# Patient Record
Sex: Female | Born: 1950 | Race: White | Hispanic: No | Marital: Married | Smoking: Former smoker
Health system: Southern US, Community
[De-identification: ages and names within clinical notes are randomized; demographics above are authoritative.]

## PROBLEM LIST (undated history)

## (undated) DIAGNOSIS — M8580 Other specified disorders of bone density and structure, unspecified site: Secondary | ICD-10-CM

## (undated) DIAGNOSIS — R569 Unspecified convulsions: Secondary | ICD-10-CM

## (undated) DIAGNOSIS — C55 Malignant neoplasm of uterus, part unspecified: Secondary | ICD-10-CM

## (undated) DIAGNOSIS — IMO0002 Reserved for concepts with insufficient information to code with codable children: Secondary | ICD-10-CM

## (undated) DIAGNOSIS — C4490 Unspecified malignant neoplasm of skin, unspecified: Secondary | ICD-10-CM

## (undated) DIAGNOSIS — C50911 Malignant neoplasm of unspecified site of right female breast: Secondary | ICD-10-CM

## (undated) HISTORY — DX: Other specified disorders of bone density and structure, unspecified site: M85.80

## (undated) HISTORY — PX: DILATION AND CURETTAGE OF UTERUS: SHX78

## (undated) HISTORY — DX: Unspecified convulsions: R56.9

## (undated) HISTORY — PX: TUBAL LIGATION: SHX77

## (undated) HISTORY — PX: SQUAMOUS CELL CARCINOMA EXCISION: SHX2433

---

## 1997-03-18 DIAGNOSIS — C50911 Malignant neoplasm of unspecified site of right female breast: Secondary | ICD-10-CM

## 1997-03-18 HISTORY — PX: MASTECTOMY: SHX3

## 1997-03-18 HISTORY — PX: BREAST BIOPSY: SHX20

## 1997-03-18 HISTORY — DX: Malignant neoplasm of unspecified site of right female breast: C50.911

## 1997-07-26 ENCOUNTER — Ambulatory Visit (HOSPITAL_COMMUNITY): Admission: RE | Admit: 1997-07-26 | Discharge: 1997-07-26 | Payer: Self-pay | Admitting: Specialist

## 1997-09-21 ENCOUNTER — Other Ambulatory Visit: Admission: RE | Admit: 1997-09-21 | Discharge: 1997-09-21 | Payer: Self-pay | Admitting: Specialist

## 1997-11-15 ENCOUNTER — Other Ambulatory Visit: Admission: RE | Admit: 1997-11-15 | Discharge: 1997-11-15 | Payer: Self-pay | Admitting: Obstetrics and Gynecology

## 1997-12-26 ENCOUNTER — Encounter: Admission: RE | Admit: 1997-12-26 | Discharge: 1998-03-26 | Payer: Self-pay | Admitting: Radiation Oncology

## 1998-03-18 DIAGNOSIS — C55 Malignant neoplasm of uterus, part unspecified: Secondary | ICD-10-CM

## 1998-03-18 HISTORY — PX: ABDOMINAL HYSTERECTOMY: SHX81

## 1998-03-18 HISTORY — DX: Malignant neoplasm of uterus, part unspecified: C55

## 1998-11-16 ENCOUNTER — Other Ambulatory Visit: Admission: RE | Admit: 1998-11-16 | Discharge: 1998-11-16 | Payer: Self-pay | Admitting: Obstetrics and Gynecology

## 1998-11-22 ENCOUNTER — Ambulatory Visit (HOSPITAL_COMMUNITY): Admission: RE | Admit: 1998-11-22 | Discharge: 1998-11-22 | Payer: Self-pay | Admitting: Obstetrics and Gynecology

## 1998-11-22 ENCOUNTER — Encounter (INDEPENDENT_AMBULATORY_CARE_PROVIDER_SITE_OTHER): Payer: Self-pay

## 1998-12-11 ENCOUNTER — Ambulatory Visit (HOSPITAL_COMMUNITY): Admission: RE | Admit: 1998-12-11 | Discharge: 1998-12-11 | Payer: Self-pay | Admitting: Gastroenterology

## 1998-12-27 ENCOUNTER — Encounter (INDEPENDENT_AMBULATORY_CARE_PROVIDER_SITE_OTHER): Payer: Self-pay | Admitting: Specialist

## 1998-12-27 ENCOUNTER — Inpatient Hospital Stay (HOSPITAL_COMMUNITY): Admission: RE | Admit: 1998-12-27 | Discharge: 1998-12-29 | Payer: Self-pay | Admitting: Obstetrics and Gynecology

## 1999-08-23 ENCOUNTER — Other Ambulatory Visit: Admission: RE | Admit: 1999-08-23 | Discharge: 1999-08-23 | Payer: Self-pay | Admitting: Obstetrics and Gynecology

## 2000-03-27 ENCOUNTER — Other Ambulatory Visit: Admission: RE | Admit: 2000-03-27 | Discharge: 2000-03-27 | Payer: Self-pay | Admitting: Obstetrics and Gynecology

## 2000-09-16 ENCOUNTER — Other Ambulatory Visit: Admission: RE | Admit: 2000-09-16 | Discharge: 2000-09-16 | Payer: Self-pay | Admitting: Obstetrics and Gynecology

## 2001-03-10 ENCOUNTER — Other Ambulatory Visit: Admission: RE | Admit: 2001-03-10 | Discharge: 2001-03-10 | Payer: Self-pay | Admitting: Obstetrics and Gynecology

## 2001-09-24 ENCOUNTER — Other Ambulatory Visit: Admission: RE | Admit: 2001-09-24 | Discharge: 2001-09-24 | Payer: Self-pay | Admitting: Obstetrics & Gynecology

## 2002-03-29 ENCOUNTER — Other Ambulatory Visit: Admission: RE | Admit: 2002-03-29 | Discharge: 2002-03-29 | Payer: Self-pay | Admitting: Obstetrics and Gynecology

## 2002-10-11 ENCOUNTER — Other Ambulatory Visit: Admission: RE | Admit: 2002-10-11 | Discharge: 2002-10-11 | Payer: Self-pay | Admitting: Obstetrics and Gynecology

## 2004-01-26 ENCOUNTER — Ambulatory Visit: Payer: Self-pay | Admitting: Hematology & Oncology

## 2004-03-13 ENCOUNTER — Ambulatory Visit: Payer: Self-pay | Admitting: Hematology & Oncology

## 2004-09-17 ENCOUNTER — Ambulatory Visit: Payer: Self-pay | Admitting: Hematology & Oncology

## 2005-04-18 ENCOUNTER — Ambulatory Visit: Payer: Self-pay | Admitting: Hematology & Oncology

## 2005-10-07 ENCOUNTER — Ambulatory Visit: Payer: Self-pay | Admitting: Hematology & Oncology

## 2005-10-11 LAB — COMPREHENSIVE METABOLIC PANEL
Albumin: 4.3 g/dL (ref 3.5–5.2)
CO2: 28 mEq/L (ref 19–32)
Calcium: 8.8 mg/dL (ref 8.4–10.5)
Glucose, Bld: 115 mg/dL — ABNORMAL HIGH (ref 70–99)
Sodium: 139 mEq/L (ref 135–145)
Total Bilirubin: 0.5 mg/dL (ref 0.3–1.2)
Total Protein: 6 g/dL (ref 6.0–8.3)

## 2005-10-11 LAB — CBC WITH DIFFERENTIAL/PLATELET
Eosinophils Absolute: 0.1 10*3/uL (ref 0.0–0.5)
HCT: 37.9 % (ref 34.8–46.6)
LYMPH%: 27.5 % (ref 14.0–48.0)
MONO#: 0.4 10*3/uL (ref 0.1–0.9)
NEUT#: 2.8 10*3/uL (ref 1.5–6.5)
Platelets: 282 10*3/uL (ref 145–400)
RBC: 4.06 10*6/uL (ref 3.70–5.32)
WBC: 4.6 10*3/uL (ref 3.9–10.0)

## 2006-04-08 ENCOUNTER — Ambulatory Visit: Payer: Self-pay | Admitting: Hematology & Oncology

## 2006-04-11 LAB — CBC WITH DIFFERENTIAL/PLATELET
Eosinophils Absolute: 0.1 10*3/uL (ref 0.0–0.5)
MONO#: 0.5 10*3/uL (ref 0.1–0.9)
NEUT#: 3.3 10*3/uL (ref 1.5–6.5)
Platelets: 272 10*3/uL (ref 145–400)
RBC: 4.22 10*6/uL (ref 3.70–5.32)
RDW: 11.9 % (ref 11.3–14.5)
WBC: 5.4 10*3/uL (ref 3.9–10.0)
lymph#: 1.4 10*3/uL (ref 0.9–3.3)

## 2006-04-11 LAB — COMPREHENSIVE METABOLIC PANEL
ALT: 31 U/L (ref 0–35)
AST: 22 U/L (ref 0–37)
Chloride: 105 mEq/L (ref 96–112)
Creatinine, Ser: 0.87 mg/dL (ref 0.40–1.20)
Sodium: 141 mEq/L (ref 135–145)
Total Bilirubin: 0.4 mg/dL (ref 0.3–1.2)
Total Protein: 6.4 g/dL (ref 6.0–8.3)

## 2006-05-23 ENCOUNTER — Encounter (INDEPENDENT_AMBULATORY_CARE_PROVIDER_SITE_OTHER): Payer: Self-pay | Admitting: Specialist

## 2006-05-23 ENCOUNTER — Ambulatory Visit (HOSPITAL_COMMUNITY): Admission: RE | Admit: 2006-05-23 | Discharge: 2006-05-23 | Payer: Self-pay | Admitting: General Surgery

## 2006-10-07 ENCOUNTER — Ambulatory Visit: Payer: Self-pay | Admitting: Hematology & Oncology

## 2006-10-10 LAB — CBC WITH DIFFERENTIAL/PLATELET
BASO%: 0.8 % (ref 0.0–2.0)
EOS%: 1.9 % (ref 0.0–7.0)
HCT: 38.1 % (ref 34.8–46.6)
LYMPH%: 27.4 % (ref 14.0–48.0)
MCH: 31.8 pg (ref 26.0–34.0)
MCHC: 34.8 g/dL (ref 32.0–36.0)
MCV: 91.6 fL (ref 81.0–101.0)
MONO%: 9.9 % (ref 0.0–13.0)
NEUT%: 60 % (ref 39.6–76.8)
Platelets: 269 10*3/uL (ref 145–400)

## 2006-10-10 LAB — COMPREHENSIVE METABOLIC PANEL
ALT: 34 U/L (ref 0–35)
AST: 28 U/L (ref 0–37)
Alkaline Phosphatase: 46 U/L (ref 39–117)
CO2: 26 mEq/L (ref 19–32)
Creatinine, Ser: 0.87 mg/dL (ref 0.40–1.20)
Total Bilirubin: 0.5 mg/dL (ref 0.3–1.2)

## 2007-04-16 ENCOUNTER — Ambulatory Visit: Payer: Self-pay | Admitting: Hematology & Oncology

## 2007-04-20 LAB — CBC WITH DIFFERENTIAL/PLATELET
Eosinophils Absolute: 0.1 10*3/uL (ref 0.0–0.5)
HCT: 39 % (ref 34.8–46.6)
LYMPH%: 25.1 % (ref 14.0–48.0)
MONO#: 0.4 10*3/uL (ref 0.1–0.9)
NEUT#: 3.9 10*3/uL (ref 1.5–6.5)
NEUT%: 66.4 % (ref 39.6–76.8)
Platelets: 266 10*3/uL (ref 145–400)
WBC: 5.9 10*3/uL (ref 3.9–10.0)

## 2007-04-20 LAB — COMPREHENSIVE METABOLIC PANEL
BUN: 21 mg/dL (ref 6–23)
CO2: 25 mEq/L (ref 19–32)
Creatinine, Ser: 1.26 mg/dL — ABNORMAL HIGH (ref 0.40–1.20)
Glucose, Bld: 95 mg/dL (ref 70–99)
Total Bilirubin: 0.5 mg/dL (ref 0.3–1.2)
Total Protein: 6.5 g/dL (ref 6.0–8.3)

## 2007-10-22 ENCOUNTER — Ambulatory Visit: Payer: Self-pay | Admitting: Hematology & Oncology

## 2007-10-26 LAB — COMPREHENSIVE METABOLIC PANEL
ALT: 25 U/L (ref 0–35)
Alkaline Phosphatase: 45 U/L (ref 39–117)
CO2: 25 mEq/L (ref 19–32)
Creatinine, Ser: 0.82 mg/dL (ref 0.40–1.20)
Glucose, Bld: 114 mg/dL — ABNORMAL HIGH (ref 70–99)
Total Bilirubin: 0.5 mg/dL (ref 0.3–1.2)

## 2007-10-26 LAB — CBC WITH DIFFERENTIAL (CANCER CENTER ONLY)
BASO%: 0.3 % (ref 0.0–2.0)
EOS%: 1.4 % (ref 0.0–7.0)
LYMPH%: 26.1 % (ref 14.0–48.0)
MCV: 93 fL (ref 81–101)
MONO#: 0.3 10*3/uL (ref 0.1–0.9)
MONO%: 7.8 % (ref 0.0–13.0)
Platelets: 272 10*3/uL (ref 145–400)
RDW: 10.9 % (ref 10.5–14.6)
WBC: 4.1 10*3/uL (ref 3.9–10.0)

## 2008-04-29 ENCOUNTER — Ambulatory Visit: Payer: Self-pay | Admitting: Hematology & Oncology

## 2008-05-02 LAB — CBC WITH DIFFERENTIAL (CANCER CENTER ONLY)
BASO%: 0.7 % (ref 0.0–2.0)
EOS%: 1.6 % (ref 0.0–7.0)
HCT: 37.9 % (ref 34.8–46.6)
LYMPH#: 1.3 10*3/uL (ref 0.9–3.3)
LYMPH%: 31.3 % (ref 14.0–48.0)
MCHC: 33.8 g/dL (ref 32.0–36.0)
MCV: 92 fL (ref 81–101)
NEUT%: 61.6 % (ref 39.6–80.0)
RDW: 10.4 % — ABNORMAL LOW (ref 10.5–14.6)

## 2008-05-02 LAB — COMPREHENSIVE METABOLIC PANEL
Albumin: 4.3 g/dL (ref 3.5–5.2)
CO2: 23 mEq/L (ref 19–32)
Glucose, Bld: 81 mg/dL (ref 70–99)
Potassium: 4 mEq/L (ref 3.5–5.3)
Sodium: 141 mEq/L (ref 135–145)
Total Protein: 6.6 g/dL (ref 6.0–8.3)

## 2009-01-20 ENCOUNTER — Ambulatory Visit: Payer: Self-pay | Admitting: Hematology & Oncology

## 2009-01-23 LAB — CBC WITH DIFFERENTIAL (CANCER CENTER ONLY)
BASO%: 0.5 % (ref 0.0–2.0)
HCT: 36.7 % (ref 34.8–46.6)
LYMPH%: 31.9 % (ref 14.0–48.0)
MCV: 90 fL (ref 81–101)
MONO#: 0.4 10*3/uL (ref 0.1–0.9)
NEUT%: 58.9 % (ref 39.6–80.0)
RDW: 11.1 % (ref 10.5–14.6)
WBC: 4.8 10*3/uL (ref 3.9–10.0)

## 2009-01-24 LAB — COMPREHENSIVE METABOLIC PANEL
Alkaline Phosphatase: 53 U/L (ref 39–117)
BUN: 15 mg/dL (ref 6–23)
Glucose, Bld: 89 mg/dL (ref 70–99)
Sodium: 143 mEq/L (ref 135–145)
Total Bilirubin: 0.4 mg/dL (ref 0.3–1.2)
Total Protein: 6.4 g/dL (ref 6.0–8.3)

## 2009-10-20 ENCOUNTER — Ambulatory Visit: Payer: Self-pay | Admitting: Hematology & Oncology

## 2009-10-23 LAB — CBC WITH DIFFERENTIAL (CANCER CENTER ONLY)
BASO#: 0 10*3/uL (ref 0.0–0.2)
EOS%: 1.7 % (ref 0.0–7.0)
HGB: 13.5 g/dL (ref 11.6–15.9)
LYMPH%: 26.1 % (ref 14.0–48.0)
MCH: 32.3 pg (ref 26.0–34.0)
MCHC: 34.4 g/dL (ref 32.0–36.0)
MCV: 94 fL (ref 81–101)
MONO%: 5.6 % (ref 0.0–13.0)
NEUT#: 3.3 10*3/uL (ref 1.5–6.5)

## 2009-10-24 LAB — COMPREHENSIVE METABOLIC PANEL
AST: 24 U/L (ref 0–37)
Albumin: 4.4 g/dL (ref 3.5–5.2)
Alkaline Phosphatase: 59 U/L (ref 39–117)
BUN: 19 mg/dL (ref 6–23)
Creatinine, Ser: 0.74 mg/dL (ref 0.40–1.20)
Potassium: 4.3 mEq/L (ref 3.5–5.3)
Total Bilirubin: 0.4 mg/dL (ref 0.3–1.2)

## 2010-08-03 NOTE — Op Note (Signed)
NAME:  Rebekah Merritt, Rebekah Merritt         ACCOUNT NO.:  [id number]   MEDICAL RECORD NO.:  [id number]          PATIENT TYPE:  AMB   LOCATION:  SDS                          FACILITY:  [facility]   PHYSICIAN:  Gabrielle Dare. Janee Morn, M.D.DATE OF BIRTH:  [birth year]   DATE OF PROCEDURE:  05/23/2006  DATE OF DISCHARGE:                               OPERATIVE REPORT   PREOPERATIVE DIAGNOSES:  1. Squamous cell carcinoma left calf.  2. Nevus, right shin.   POSTOPERATIVE DIAGNOSES:  1. Squamous cell carcinoma left calf.  2. Nevus, right shin.   PROCEDURES:  1. Excision nevus, right shin.  2. Wide excision squamous cell carcinoma left calf.   SURGEON:  Gabrielle Dare. Janee Morn, M.D.   HISTORY OF PRESENT ILLNESS:  The patient is a 60 year old female who I  evaluated in the office for squamous cell carcinoma of her left calf.  We plan wide excision of this area.  In addition, she had a nevus on her  right shin that she was concerned about.  We will plan to excise that at  the same time.   PROCEDURE IN DETAIL:  Informed consent was obtained.  The patient  received intravenous antibiotics.  She is brought to the operating room.  MAC anesthesia was administered by the anesthesia staff.  Both calves  were prepped and draped in sterile fashion.  Attention was first  directed to the nevus on the right shin.  A mixture of 0.50% Marcaine  epinephrine and 1% lidocaine was injected around the nevus. The nevus  was excised with an elliptical incision.  Wound was irrigated.  Hemostasis was obtained.  The wound was closed with running 4-0 Vicryl  subcuticular stitch.  Benzoin and Steri-Strips were applied.  Next,  attention was directed to the right shin.  A elliptical excision was  planned and measured out, giving good margins of least about a  centimeter and the area was infiltrated thoroughly with the local  anesthetic mixture.  Elliptical incision was made.  Subcutaneous tissues  were dissected down to the fascia and  it was removed and one excision.  The specimen was oriented for pathology with a silk suture.  Hemostasis  was obtained in the wound. Wound was irrigated.  Subcutaneous flaps were  raised medially and laterally over the fascia.  The wound was then  closed in layers.  The size of the excision was 8 x 3.5 cm.  The  subcutaneous tissues were approximated with interrupted 2-0 Vicryl  suture.  The wound was again irrigated and the  skin was closed with interrupted simple 3-0 nylon sutures.  The wound  came together without any significant tension.  Sterile dressings were  applied.  The patient tolerated the procedure well without apparent  complication.  Sponge, needle and instrument counts were all correct.  She was taken to the recovery in stable condition.      Gabrielle Dare Janee Morn, M.D.  Electronically Signed     BET/MEDQ  D:  05/23/2006  T:  05/23/2006  Job:  [id number]   cc:   Hope M. Danella Deis, M.D.

## 2010-10-15 ENCOUNTER — Encounter (HOSPITAL_BASED_OUTPATIENT_CLINIC_OR_DEPARTMENT_OTHER): Payer: BC Managed Care – PPO | Admitting: Hematology & Oncology

## 2010-10-15 ENCOUNTER — Other Ambulatory Visit: Payer: Self-pay | Admitting: Hematology & Oncology

## 2010-10-15 DIAGNOSIS — Z853 Personal history of malignant neoplasm of breast: Secondary | ICD-10-CM

## 2010-10-15 DIAGNOSIS — Z8542 Personal history of malignant neoplasm of other parts of uterus: Secondary | ICD-10-CM

## 2010-10-15 LAB — CBC WITH DIFFERENTIAL (CANCER CENTER ONLY)
Eosinophils Absolute: 0.1 10*3/uL (ref 0.0–0.5)
LYMPH%: 30.5 % (ref 14.0–48.0)
MCH: 32.2 pg (ref 26.0–34.0)
MCHC: 35 g/dL (ref 32.0–36.0)
MCV: 92 fL (ref 81–101)
MONO%: 10 % (ref 0.0–13.0)
Platelets: 305 10*3/uL (ref 145–400)
RBC: 4.29 10*6/uL (ref 3.70–5.32)

## 2010-10-15 LAB — COMPREHENSIVE METABOLIC PANEL
ALT: 32 U/L (ref 0–35)
AST: 27 U/L (ref 0–37)
Alkaline Phosphatase: 56 U/L (ref 39–117)
Chloride: 104 mEq/L (ref 96–112)
Creatinine, Ser: 0.78 mg/dL (ref 0.50–1.10)
Potassium: 4.2 mEq/L (ref 3.5–5.3)
Total Bilirubin: 0.5 mg/dL (ref 0.3–1.2)

## 2011-10-14 ENCOUNTER — Other Ambulatory Visit (HOSPITAL_BASED_OUTPATIENT_CLINIC_OR_DEPARTMENT_OTHER): Payer: BC Managed Care – PPO | Admitting: Lab

## 2011-10-14 ENCOUNTER — Ambulatory Visit (HOSPITAL_BASED_OUTPATIENT_CLINIC_OR_DEPARTMENT_OTHER): Payer: BC Managed Care – PPO | Admitting: Hematology & Oncology

## 2011-10-14 VITALS — BP 117/77 | HR 83 | Temp 97.4°F | Ht 66.0 in | Wt 144.0 lb

## 2011-10-14 DIAGNOSIS — Z853 Personal history of malignant neoplasm of breast: Secondary | ICD-10-CM

## 2011-10-14 DIAGNOSIS — C50919 Malignant neoplasm of unspecified site of unspecified female breast: Secondary | ICD-10-CM

## 2011-10-14 LAB — CBC WITH DIFFERENTIAL (CANCER CENTER ONLY)
BASO%: 0.7 % (ref 0.0–2.0)
HCT: 38.3 % (ref 34.8–46.6)
LYMPH#: 1.4 10*3/uL (ref 0.9–3.3)
MONO#: 0.4 10*3/uL (ref 0.1–0.9)
NEUT#: 2.4 10*3/uL (ref 1.5–6.5)
Platelets: 235 10*3/uL (ref 145–400)
RDW: 11.4 % (ref 11.1–15.7)
WBC: 4.3 10*3/uL (ref 3.9–10.0)

## 2011-10-14 LAB — COMPREHENSIVE METABOLIC PANEL
ALT: 26 U/L (ref 0–35)
AST: 20 U/L (ref 0–37)
CO2: 27 mEq/L (ref 19–32)
Calcium: 8.8 mg/dL (ref 8.4–10.5)
Chloride: 106 mEq/L (ref 96–112)
Sodium: 140 mEq/L (ref 135–145)
Total Protein: 6.1 g/dL (ref 6.0–8.3)

## 2011-10-14 NOTE — Progress Notes (Signed)
This office note has been dictated.

## 2011-10-15 NOTE — Progress Notes (Signed)
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  CURRENT THERAPY:  Observation.  INTERIM HISTORY:  Rebekah Merritt comes in for her yearly followup.  She is doing well.  She really has no specific complaints.  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  She may do this for another year.  She has not noted any problems with cough or shortness of breath.  She has had no nausea or vomiting.  There has been no change in bowel or bladder habits.  She has had no headache.  There have been no skin problems.  She has not had any change in medications.  PHYSICAL EXAMINATION:  This is a well-developed, well-nourished white female in no obvious distress.  Vital signs:  97.4, pulse 83, respiratory rate 18, blood pressure 117/77.  Weight is 144.  Head and neck:  Normocephalic, atraumatic skull.  There are no ocular or oral lesions.  There are no palpable cervical or supraclavicular lymph nodes. Lungs:  Clear bilaterally.  Cardiac:  Regular rate and rhythm with a normal S1 and S2.  There are no murmurs, rubs or bruits.  Breasts:  Left breast with no masses, edema or erythema.  There is no left axillary adenopathy.  Right breast is status post mastectomy.  There are no right chest wall nodules, erythema or warmth.  There is no right axillary adenopathy.  Back:  No tenderness over the spine, ribs, or hips. Abdomen:  Soft with good bowel sounds.  There is no palpable abdominal mass.  There is no fluid wave.  No palpable hepatosplenomegaly. Extremities:  No clubbing, cyanosis or edema.  She has good range of motion of her joints.  Neurologic:  No focal neurological deficits.  LABORATORY STUDIES:  White cell count is 4.3, hemoglobin 12.9, hematocrit 38.3, platelet count 235.  Sodium 140, potassium 4, BUN 15, creatinine 0.72.  Calcium 8.8 with an albumin of 4.1.  IMPRESSION:  Rebekah Merritt is a 61 year old white  female with a past history of stage IIA ductal carcinoma of the right breast.  She underwent mastectomy.  She did undergo adjuvant chemotherapy.  She had 1 lymph node that was positive.  I do not see any evidence of recurrence.  We reassured her. We will plan to get her back yearly. I did tell her to take an extra vitamin D.  We are checking her vitamin D levels.  I just want to make sure she gets enough vitamin D in her.  I also want to make sure she takes a baby aspirin a day.   ______________________________ Josph Macho, M.D. PRE/MEDQ  D:  10/14/2011  T:  10/15/2011  Job:  [id number]

## 2011-10-17 ENCOUNTER — Telehealth: Payer: Self-pay | Admitting: *Deleted

## 2011-10-17 NOTE — Telephone Encounter (Signed)
Called patient to let her know that her labwork was all great per dr. Myna Hidalgo.  Left message on patients personal cell phone

## 2011-10-17 NOTE — Telephone Encounter (Signed)
Message copied by Anselm Jungling on Thu Oct 17, 2011 10:23 AM ------      Message from: Arlan Organ R      Created: Mon Oct 14, 2011  5:51 PM       Call - labs look great!!!  pete

## 2012-09-10 ENCOUNTER — Other Ambulatory Visit: Payer: Self-pay | Admitting: Dermatology

## 2012-10-13 ENCOUNTER — Ambulatory Visit (HOSPITAL_BASED_OUTPATIENT_CLINIC_OR_DEPARTMENT_OTHER): Payer: BC Managed Care – PPO | Admitting: Hematology & Oncology

## 2012-10-13 ENCOUNTER — Ambulatory Visit (HOSPITAL_BASED_OUTPATIENT_CLINIC_OR_DEPARTMENT_OTHER): Payer: BC Managed Care – PPO | Admitting: Lab

## 2012-10-13 VITALS — BP 109/71 | HR 81 | Temp 98.3°F | Resp 16 | Ht 66.0 in | Wt 144.0 lb

## 2012-10-13 DIAGNOSIS — C50911 Malignant neoplasm of unspecified site of right female breast: Secondary | ICD-10-CM

## 2012-10-13 DIAGNOSIS — Z853 Personal history of malignant neoplasm of breast: Secondary | ICD-10-CM

## 2012-10-13 DIAGNOSIS — C50919 Malignant neoplasm of unspecified site of unspecified female breast: Secondary | ICD-10-CM

## 2012-10-13 DIAGNOSIS — M810 Age-related osteoporosis without current pathological fracture: Secondary | ICD-10-CM

## 2012-10-13 LAB — CBC WITH DIFFERENTIAL (CANCER CENTER ONLY)
BASO%: 0.6 % (ref 0.0–2.0)
Eosinophils Absolute: 0.1 10*3/uL (ref 0.0–0.5)
LYMPH#: 1.5 10*3/uL (ref 0.9–3.3)
MONO#: 0.5 10*3/uL (ref 0.1–0.9)
Platelets: 266 10*3/uL (ref 145–400)
RBC: 4.38 10*6/uL (ref 3.70–5.32)
RDW: 11.4 % (ref 11.1–15.7)
WBC: 5 10*3/uL (ref 3.9–10.0)

## 2012-10-13 NOTE — Progress Notes (Signed)
This office note has been dictated.

## 2012-10-14 LAB — VITAMIN D 25 HYDROXY (VIT D DEFICIENCY, FRACTURES): Vit D, 25-Hydroxy: 46 ng/mL (ref 30–89)

## 2012-10-14 LAB — COMPREHENSIVE METABOLIC PANEL
ALT: 24 U/L (ref 0–35)
Albumin: 4.5 g/dL (ref 3.5–5.2)
CO2: 28 mEq/L (ref 19–32)
Calcium: 9.2 mg/dL (ref 8.4–10.5)
Chloride: 104 mEq/L (ref 96–112)
Potassium: 4.4 mEq/L (ref 3.5–5.3)
Sodium: 138 mEq/L (ref 135–145)
Total Protein: 6.8 g/dL (ref 6.0–8.3)

## 2012-10-14 NOTE — Progress Notes (Signed)
CC:   Dario Guardian, M.D.  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  CURRENT THERAPY:  Observation.  INTERIM HISTORY:  Ms. Asberry comes in for followup.  She is doing well.  We see her yearly.  Since we last saw her, she has had no problems.  She has had no fevers, sweats, or chills.  She got through the wintertime without any problem with infections.  There has been no changes with bowels or bladder.  She has had no abdominal pain.  She has had no cough.  She has had no leg swelling.  There has been no rashes.  PHYSICAL EXAMINATION:  General:  This is a well-developed, well- nourished white female in no obvious distress.  Vital signs: Temperature of 98.3, pulse 81, respiratory rate 16, blood pressure 109/71.  Weight is 144.  Head and neck:  Normocephalic, atraumatic skull.  There are no ocular or oral lesions.  There are no palpable cervical or supraclavicular lymph nodes.  Lungs:  Clear bilaterally. Cardiac:  Regular rate and rhythm with a normal S1, S2.  There are no murmurs, rubs or bruits.  Breasts:  Shows left breast with no masses, edema or erythema.  There is no left axillary adenopathy.  Right chest wall shows well-healed mastectomy.  There is no right chest wall nodules.  Extremities:  Show no clubbing, cyanosis or edema.  Back: Shows no kyphosis.  Abdomen:  Soft.  She has good bowel sounds.  There is no fluid wave.  There is no palpable hepatosplenomegaly.  Skin: Shows no rashes, ecchymosis, or petechia.  LABORATORY STUDIES:  White cell count 5, hemoglobin 14, hematocrit 41, platelet count 266.  IMPRESSION:  Ms. Rehm is a very charming 62 year old white female with stage IIA ductal carcinoma of the right breast.  She had this 16 years ago.  She had 1 positive lymph node.  She underwent a mastectomy. She had adjuvant chemotherapy.  For now, we will get her back yearly.  I do not see any need for any labs or x-rays in between  visits.  She will continue to take her vitamin D and also baby aspirin.    ______________________________ Josph Macho, M.D. PRE/MEDQ  D:  10/13/2012  T:  10/14/2012  Job:  [id number]

## 2012-10-15 ENCOUNTER — Telehealth: Payer: Self-pay | Admitting: Oncology

## 2012-10-15 ENCOUNTER — Encounter: Payer: Self-pay | Admitting: Oncology

## 2012-10-15 NOTE — Telephone Encounter (Addendum)
Message copied by Lacie Draft on Thu Oct 15, 2012  4:40 PM ------      Message from: Arlan Organ R      Created: Wed Oct 14, 2012  9:08 PM       Call - labs are ok!!  Please mail to her!!  Cindee Lame ------Spoke with patient, mailed labs also.

## 2012-11-18 ENCOUNTER — Encounter: Payer: Self-pay | Admitting: Hematology & Oncology

## 2013-09-14 ENCOUNTER — Other Ambulatory Visit: Payer: Self-pay | Admitting: Dermatology

## 2013-10-13 ENCOUNTER — Ambulatory Visit (HOSPITAL_BASED_OUTPATIENT_CLINIC_OR_DEPARTMENT_OTHER): Payer: BC Managed Care – PPO | Admitting: Hematology & Oncology

## 2013-10-13 ENCOUNTER — Other Ambulatory Visit (HOSPITAL_BASED_OUTPATIENT_CLINIC_OR_DEPARTMENT_OTHER): Payer: BC Managed Care – PPO | Admitting: Lab

## 2013-10-13 VITALS — BP 130/74 | HR 78 | Temp 98.5°F | Resp 16 | Wt 125.0 lb

## 2013-10-13 DIAGNOSIS — Z853 Personal history of malignant neoplasm of breast: Secondary | ICD-10-CM

## 2013-10-13 DIAGNOSIS — C50911 Malignant neoplasm of unspecified site of right female breast: Secondary | ICD-10-CM

## 2013-10-13 DIAGNOSIS — M810 Age-related osteoporosis without current pathological fracture: Secondary | ICD-10-CM

## 2013-10-13 DIAGNOSIS — C50919 Malignant neoplasm of unspecified site of unspecified female breast: Secondary | ICD-10-CM

## 2013-10-13 DIAGNOSIS — Z171 Estrogen receptor negative status [ER-]: Principal | ICD-10-CM

## 2013-10-13 LAB — CBC WITH DIFFERENTIAL (CANCER CENTER ONLY)
BASO#: 0 10*3/uL (ref 0.0–0.2)
BASO%: 1.1 % (ref 0.0–2.0)
EOS ABS: 0.1 10*3/uL (ref 0.0–0.5)
EOS%: 3.9 % (ref 0.0–7.0)
HCT: 41.3 % (ref 34.8–46.6)
HGB: 14.1 g/dL (ref 11.6–15.9)
LYMPH#: 1.1 10*3/uL (ref 0.9–3.3)
LYMPH%: 31.5 % (ref 14.0–48.0)
MCH: 32.3 pg (ref 26.0–34.0)
MCHC: 34.1 g/dL (ref 32.0–36.0)
MCV: 95 fL (ref 81–101)
MONO#: 0.4 10*3/uL (ref 0.1–0.9)
MONO%: 10.7 % (ref 0.0–13.0)
NEUT%: 52.8 % (ref 39.6–80.0)
NEUTROS ABS: 1.9 10*3/uL (ref 1.5–6.5)
Platelets: 253 10*3/uL (ref 145–400)
RBC: 4.36 10*6/uL (ref 3.70–5.32)
RDW: 11.4 % (ref 11.1–15.7)
WBC: 3.6 10*3/uL — AB (ref 3.9–10.0)

## 2013-10-13 LAB — CMP (CANCER CENTER ONLY)
ALK PHOS: 47 U/L (ref 26–84)
ALT: 24 U/L (ref 10–47)
AST: 31 U/L (ref 11–38)
Albumin: 3.9 g/dL (ref 3.3–5.5)
BUN, Bld: 17 mg/dL (ref 7–22)
CO2: 28 meq/L (ref 18–33)
Calcium: 9 mg/dL (ref 8.0–10.3)
Chloride: 99 mEq/L (ref 98–108)
Creat: 0.8 mg/dl (ref 0.6–1.2)
Glucose, Bld: 92 mg/dL (ref 73–118)
Potassium: 4.1 mEq/L (ref 3.3–4.7)
SODIUM: 143 meq/L (ref 128–145)
TOTAL PROTEIN: 7.1 g/dL (ref 6.4–8.1)
Total Bilirubin: 0.8 mg/dl (ref 0.20–1.60)

## 2013-10-14 LAB — VITAMIN D 25 HYDROXY (VIT D DEFICIENCY, FRACTURES): Vit D, 25-Hydroxy: 66 ng/mL (ref 30–89)

## 2013-10-14 NOTE — Progress Notes (Signed)
Hematology and Oncology Follow Up Visit  SHAILAH GIBBINS [id number] 25-May-1950 63 y.o. 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  Current Therapy:   Observation.     Interim History:  Ms.  Pearcy is in for her yearly followup. She is doing okay. Her husband, unforeseeing, had a heart attack. This actually was when [place] ago. He's doing well right now.  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  She's had no problems with nausea vomiting. There's been no bony pain. She has had no change in bowel or bladder habits. She's had no fatigue. There's been no rashes.  She is due for a mammogram in August.  She's had no issues with taking a baby aspirin. She is taking vitamin D.  Medications: Current outpatient prescriptions:Calcium Carbonate-Vit D-Min (CALCIUM 1200 PO), Take by mouth every morning., Disp: , Rfl: ;  Cyanocobalamin (VITAMIN B-12) 6000 MCG SUBL, Place under the tongue every morning., Disp: , Rfl: ;  desonide (DESOWEN) 0.05 % cream, , Disp: , Rfl: ;  l-methylfolate-b2-b6-b12 (CEREFOLIN) 08-16-48-5 MG TABS, Take 1 tablet by mouth daily., Disp: , Rfl:  Multiple Vitamin (MULTI-VITAMIN DAILY) TABS, Take by mouth every morning., Disp: , Rfl: ;  Omega-3 Fatty Acids (OMEGA 3 PO), Take by mouth every morning., Disp: , Rfl: ;  venlafaxine XR (EFFEXOR-XR) 75 MG 24 hr capsule, , Disp: , Rfl:   Allergies: No Known Allergies  Past Medical History, Surgical history, Social history, and Family History were reviewed and updated.  Review of Systems: As above  Physical Exam:  weight is 125 lb (56.7 kg). Her oral temperature is 98.5 F (36.9 C). Her blood pressure is 130/74 and her pulse is 78. Her respiration is 16.   Well-developed and well-nourished white female in no obvious distress. Head and neck exam shows no ocular or oral lesion. There are no palpable cervical or supraclavicular lymph  nodes. Lungs are clear bilaterally. Cardiac exam regular rate and rhythm with no murmurs rubs or bruits. Breast exam shows left breast with no masses edema or erythema. There is no left axillary adenopathy. Right chest wall shows a well-healed mastectomy. No right chest wall nodules are noted. There is no right axillary adenopathy. Back exam no tenderness over the spine. There is no kyphosis. Abdomen is soft. Has good bowel sounds. There is no fluid wave. Extremities shows no clubbing cyanosis or edema. Skin exam no rashes. Neurological exam is nonfocal.  Lab Results  Component Value Date   WBC 3.6* 10/13/2013   HGB 14.1 10/13/2013   HCT 41.3 10/13/2013   MCV 95 10/13/2013   PLT 253 10/13/2013     Chemistry      Component Value Date/Time   NA 143 10/13/2013 0945   NA 138 10/13/2012 1013   K 4.1 10/13/2013 0945   K 4.4 10/13/2012 1013   CL 99 10/13/2013 0945   CL 104 10/13/2012 1013   CO2 28 10/13/2013 0945   CO2 28 10/13/2012 1013   BUN 17 10/13/2013 0945   BUN 15 10/13/2012 1013   CREATININE 0.8 10/13/2013 0945   CREATININE 0.76 10/13/2012 1013      Component Value Date/Time   CALCIUM 9.0 10/13/2013 0945   CALCIUM 9.2 10/13/2012 1013   ALKPHOS 47 10/13/2013 0945   ALKPHOS 50 10/13/2012 1013   AST 31 10/13/2013 0945   AST 21 10/13/2012 1013   ALT 24 10/13/2013 0945  ALT 24 10/13/2012 1013   BILITOT 0.80 10/13/2013 0945   BILITOT 0.6 10/13/2012 1013         Impression and Plan: Ms. Coppa is 63 year old white female with a history of stage IIA ductal carcinoma the right breast. She had a mastectomy. She had one positive lymph node. Her tumor was ER negative. She received adjuvant chemotherapy.  She now is a from treatment by over 17 years.  For now, we will go ahead and plan to get her back in one year. She still likes to come back to see [place].   Volanda Napoleon, MD 7/30/20151:51 PM

## 2013-10-15 ENCOUNTER — Telehealth: Payer: Self-pay | Admitting: *Deleted

## 2013-10-15 NOTE — Telephone Encounter (Addendum)
Message copied by Lenn Sink on Fri Oct 15, 2013  8:36 AM ------      Message from: Burney Gauze R      Created: Thu Oct 14, 2013  6:12 PM       Call and let her know that her labs and vitamin D are fantastic. Thanks. Pete ------Informed pt that vit D and labs look good.

## 2014-08-16 ENCOUNTER — Other Ambulatory Visit: Payer: Self-pay

## 2014-08-16 DIAGNOSIS — C50911 Malignant neoplasm of unspecified site of right female breast: Secondary | ICD-10-CM

## 2014-08-16 DIAGNOSIS — Z9011 Acquired absence of right breast and nipple: Secondary | ICD-10-CM

## 2014-08-16 DIAGNOSIS — Z1231 Encounter for screening mammogram for malignant neoplasm of breast: Secondary | ICD-10-CM

## 2014-10-12 ENCOUNTER — Other Ambulatory Visit (HOSPITAL_BASED_OUTPATIENT_CLINIC_OR_DEPARTMENT_OTHER): Payer: PPO

## 2014-10-12 ENCOUNTER — Encounter: Payer: Self-pay | Admitting: Hematology & Oncology

## 2014-10-12 ENCOUNTER — Ambulatory Visit (HOSPITAL_BASED_OUTPATIENT_CLINIC_OR_DEPARTMENT_OTHER): Payer: PPO | Admitting: Hematology & Oncology

## 2014-10-12 VITALS — BP 118/74 | HR 75 | Temp 98.0°F | Resp 14 | Ht 66.0 in | Wt 122.0 lb

## 2014-10-12 DIAGNOSIS — Z853 Personal history of malignant neoplasm of breast: Secondary | ICD-10-CM | POA: Diagnosis not present

## 2014-10-12 DIAGNOSIS — Z170 Estrogen receptor positive status [ER+]: Principal | ICD-10-CM

## 2014-10-12 DIAGNOSIS — C50911 Malignant neoplasm of unspecified site of right female breast: Secondary | ICD-10-CM

## 2014-10-12 DIAGNOSIS — M810 Age-related osteoporosis without current pathological fracture: Secondary | ICD-10-CM

## 2014-10-12 DIAGNOSIS — E559 Vitamin D deficiency, unspecified: Secondary | ICD-10-CM

## 2014-10-12 DIAGNOSIS — Z171 Estrogen receptor negative status [ER-]: Principal | ICD-10-CM

## 2014-10-12 LAB — COMPREHENSIVE METABOLIC PANEL (CC13)
ALK PHOS: 64 U/L (ref 40–150)
ALT: 42 U/L (ref 0–55)
ANION GAP: 7 meq/L (ref 3–11)
AST: 34 U/L (ref 5–34)
Albumin: 3.8 g/dL (ref 3.5–5.0)
BUN: 19.7 mg/dL (ref 7.0–26.0)
CO2: 29 mEq/L (ref 22–29)
Calcium: 8.9 mg/dL (ref 8.4–10.4)
Chloride: 105 mEq/L (ref 98–109)
Creatinine: 0.8 mg/dL (ref 0.6–1.1)
EGFR: 77 mL/min/{1.73_m2} — AB (ref >90)
Glucose: 93 mg/dl (ref 70–140)
Potassium: 4.4 mEq/L (ref 3.5–5.1)
SODIUM: 141 meq/L (ref 136–145)
Total Bilirubin: 0.38 mg/dL (ref 0.20–1.20)
Total Protein: 6.3 g/dL — ABNORMAL LOW (ref 6.4–8.3)

## 2014-10-12 LAB — CBC WITH DIFFERENTIAL (CANCER CENTER ONLY)
BASO#: 0.1 10*3/uL (ref 0.0–0.2)
BASO%: 1 % (ref 0.0–2.0)
EOS%: 4 % (ref 0.0–7.0)
Eosinophils Absolute: 0.2 10*3/uL (ref 0.0–0.5)
HCT: 37.9 % (ref 34.8–46.6)
HGB: 12.9 g/dL (ref 11.6–15.9)
LYMPH#: 1.1 10*3/uL (ref 0.9–3.3)
LYMPH%: 20.8 % (ref 14.0–48.0)
MCH: 31.9 pg (ref 26.0–34.0)
MCHC: 34 g/dL (ref 32.0–36.0)
MCV: 94 fL (ref 81–101)
MONO#: 0.6 10*3/uL (ref 0.1–0.9)
MONO%: 11.7 % (ref 0.0–13.0)
NEUT#: 3.2 10*3/uL (ref 1.5–6.5)
NEUT%: 62.5 % (ref 39.6–80.0)
Platelets: 287 10*3/uL (ref 145–400)
RBC: 4.05 10*6/uL (ref 3.70–5.32)
RDW: 11.9 % (ref 11.1–15.7)
WBC: 5.1 10*3/uL (ref 3.9–10.0)

## 2014-10-12 LAB — VITAMIN D 25 HYDROXY (VIT D DEFICIENCY, FRACTURES): VIT D 25 HYDROXY: 43 ng/mL (ref 30–100)

## 2014-10-13 ENCOUNTER — Telehealth: Payer: Self-pay | Admitting: *Deleted

## 2014-10-13 NOTE — Telephone Encounter (Addendum)
Patient aware of results  ----- Message from Volanda Napoleon, MD sent at 10/12/2014  1:31 PM EDT ----- Please call and tell her that her labs look fantastic. I will call her next with her vitamin D level. Thanks  Call - VIt D level is great!!!! pete

## 2014-10-13 NOTE — Progress Notes (Signed)
Hematology and Oncology Follow Up Visit  Rebekah Merritt [id number] 12/16/1950 64 y.o. 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  Current Therapy:   Observation.     Interim History:  Ms.  Merritt is in for her yearly followup. She is doing okay. Her husband, is doing well. He had a heart attack about a year and half ago. He is recovered from this totally.,   She is now retired. She is enjoying retirement. She is trying to stay active..  She's had no problems with nausea or vomiting. There's been no bony pain. She has had no change in bowel or bladder habits. She's had no fatigue. There's been no rashes.  She is due for a mammogram in August.  She's had no issues with taking a baby aspirin. She is taking vitamin D. Her vitamin D level when last saw her was 44.  Overall, her performance status is ECOG 0.  Medications:  Current outpatient prescriptions:  .  Calcium Carbonate-Vit D-Min (CALCIUM 1200 PO), Take by mouth every morning., Disp: , Rfl:  .  Cyanocobalamin (VITAMIN B-12) 6000 MCG SUBL, Place under the tongue every morning., Disp: , Rfl:  .  l-methylfolate-b2-b6-b12 (CEREFOLIN) 08-16-48-5 MG TABS, Take 1 tablet by mouth daily., Disp: , Rfl:  .  Multiple Vitamin (MULTI-VITAMIN DAILY) TABS, Take by mouth every morning., Disp: , Rfl:  .  Omega-3 Fatty Acids (OMEGA 3 PO), Take by mouth every morning., Disp: , Rfl:  .  venlafaxine XR (EFFEXOR-XR) 75 MG 24 hr capsule, , Disp: , Rfl:   Allergies: No Known Allergies  Past Medical History, Surgical history, Social history, and Family History were reviewed and updated.  Review of Systems: As above  Physical Exam:  height is 5\' 6"  (1.676 m) and weight is 122 lb (55.339 kg). Her oral temperature is 98 F (36.7 C). Her blood pressure is 118/74 and her pulse is 75. Her respiration is 14.   Well-developed and well-nourished white female in no obvious distress. Head and neck  exam shows no ocular or oral lesion. There are no palpable cervical or supraclavicular lymph nodes. Lungs are clear bilaterally. Cardiac exam regular rate and rhythm with no murmurs rubs or bruits. Breast exam shows left breast with no masses edema or erythema. There is no left axillary adenopathy. Right chest wall shows a well-healed mastectomy. No right chest wall nodules are noted. There is no right axillary adenopathy. Back exam no tenderness over the spine. There is no kyphosis. Abdomen is soft. Has good bowel sounds. There is no fluid wave. Extremities shows no clubbing cyanosis or edema. Skin exam no rashes. Neurological exam is nonfocal.  Lab Results  Component Value Date   WBC 5.1 10/12/2014   HGB 12.9 10/12/2014   HCT 37.9 10/12/2014   MCV 94 10/12/2014   PLT 287 10/12/2014     Chemistry      Component Value Date/Time   NA 141 10/12/2014 0950   NA 143 10/13/2013 0945   NA 138 10/13/2012 1013   K 4.4 10/12/2014 0950   K 4.1 10/13/2013 0945   K 4.4 10/13/2012 1013   CL 99 10/13/2013 0945   CL 104 10/13/2012 1013   CO2 29 10/12/2014 0950   CO2 28 10/13/2013 0945   CO2 28 10/13/2012 1013   BUN 19.7 10/12/2014 0950   BUN 17 10/13/2013 0945   BUN 15 10/13/2012 1013   CREATININE 0.8 10/12/2014 0950  CREATININE 0.8 10/13/2013 0945   CREATININE 0.76 10/13/2012 1013      Component Value Date/Time   CALCIUM 8.9 10/12/2014 0950   CALCIUM 9.0 10/13/2013 0945   CALCIUM 9.2 10/13/2012 1013   ALKPHOS 64 10/12/2014 0950   ALKPHOS 47 10/13/2013 0945   ALKPHOS 50 10/13/2012 1013   AST 34 10/12/2014 0950   AST 31 10/13/2013 0945   AST 21 10/13/2012 1013   ALT 42 10/12/2014 0950   ALT 24 10/13/2013 0945   ALT 24 10/13/2012 1013   BILITOT 0.38 10/12/2014 0950   BILITOT 0.80 10/13/2013 0945   BILITOT 0.6 10/13/2012 1013         Impression and Plan: Rebekah Merritt is 64 year old white female with a history of stage IIA ductal carcinoma the right breast. She had a mastectomy.  She had one positive lymph node. Her tumor was ER negative. She received adjuvant chemotherapy.  She now is out from treatment by over 17 years. I truly believe that she is cured. She will lites come back to see [place] as she feels confident that we will give her a thorough examination to make sure nothing is happening with her breast cancer.  For now, we will go ahead and plan to get her back in one year.    Volanda Napoleon, MD 7/28/20167:39 AM

## 2014-12-06 ENCOUNTER — Ambulatory Visit
Admission: RE | Admit: 2014-12-06 | Discharge: 2014-12-06 | Disposition: A | Discharge status: Home / Self Care | Payer: PPO | Source: Ambulatory Visit

## 2014-12-06 DIAGNOSIS — Z9011 Acquired absence of right breast and nipple: Secondary | ICD-10-CM

## 2014-12-06 DIAGNOSIS — C50911 Malignant neoplasm of unspecified site of right female breast: Secondary | ICD-10-CM

## 2014-12-06 DIAGNOSIS — Z1231 Encounter for screening mammogram for malignant neoplasm of breast: Secondary | ICD-10-CM

## 2015-06-26 DIAGNOSIS — M797 Fibromyalgia: Secondary | ICD-10-CM | POA: Diagnosis not present

## 2015-06-26 DIAGNOSIS — Z853 Personal history of malignant neoplasm of breast: Secondary | ICD-10-CM | POA: Diagnosis not present

## 2015-06-26 DIAGNOSIS — Z0000 Encounter for general adult medical examination without abnormal findings: Secondary | ICD-10-CM | POA: Diagnosis not present

## 2015-06-26 DIAGNOSIS — Z1211 Encounter for screening for malignant neoplasm of colon: Secondary | ICD-10-CM | POA: Diagnosis not present

## 2015-06-26 DIAGNOSIS — M8580 Other specified disorders of bone density and structure, unspecified site: Secondary | ICD-10-CM | POA: Diagnosis not present

## 2015-06-26 DIAGNOSIS — E782 Mixed hyperlipidemia: Secondary | ICD-10-CM | POA: Diagnosis not present

## 2015-07-14 DIAGNOSIS — Z1211 Encounter for screening for malignant neoplasm of colon: Secondary | ICD-10-CM | POA: Diagnosis not present

## 2015-09-21 DIAGNOSIS — Z86018 Personal history of other benign neoplasm: Secondary | ICD-10-CM | POA: Diagnosis not present

## 2015-09-21 DIAGNOSIS — D2230 Melanocytic nevi of unspecified part of face: Secondary | ICD-10-CM | POA: Diagnosis not present

## 2015-09-21 DIAGNOSIS — D485 Neoplasm of uncertain behavior of skin: Secondary | ICD-10-CM | POA: Diagnosis not present

## 2015-09-21 DIAGNOSIS — L821 Other seborrheic keratosis: Secondary | ICD-10-CM | POA: Diagnosis not present

## 2015-09-21 DIAGNOSIS — D225 Melanocytic nevi of trunk: Secondary | ICD-10-CM | POA: Diagnosis not present

## 2015-09-21 DIAGNOSIS — D2272 Melanocytic nevi of left lower limb, including hip: Secondary | ICD-10-CM | POA: Diagnosis not present

## 2015-09-21 DIAGNOSIS — L720 Epidermal cyst: Secondary | ICD-10-CM | POA: Diagnosis not present

## 2015-09-21 DIAGNOSIS — Z85828 Personal history of other malignant neoplasm of skin: Secondary | ICD-10-CM | POA: Diagnosis not present

## 2015-10-12 ENCOUNTER — Ambulatory Visit (HOSPITAL_BASED_OUTPATIENT_CLINIC_OR_DEPARTMENT_OTHER): Payer: PPO | Admitting: Hematology & Oncology

## 2015-10-12 ENCOUNTER — Other Ambulatory Visit (HOSPITAL_BASED_OUTPATIENT_CLINIC_OR_DEPARTMENT_OTHER): Payer: PPO

## 2015-10-12 VITALS — BP 116/78 | HR 79 | Temp 98.2°F | Resp 18 | Wt 130.0 lb

## 2015-10-12 DIAGNOSIS — C50911 Malignant neoplasm of unspecified site of right female breast: Secondary | ICD-10-CM

## 2015-10-12 DIAGNOSIS — Z853 Personal history of malignant neoplasm of breast: Secondary | ICD-10-CM

## 2015-10-12 DIAGNOSIS — Z170 Estrogen receptor positive status [ER+]: Principal | ICD-10-CM

## 2015-10-12 DIAGNOSIS — E559 Vitamin D deficiency, unspecified: Secondary | ICD-10-CM

## 2015-10-12 DIAGNOSIS — M818 Other osteoporosis without current pathological fracture: Secondary | ICD-10-CM

## 2015-10-12 DIAGNOSIS — T386X5A Adverse effect of antigonadotrophins, antiestrogens, antiandrogens, not elsewhere classified, initial encounter: Secondary | ICD-10-CM

## 2015-10-12 LAB — CBC WITH DIFFERENTIAL (CANCER CENTER ONLY)
BASO#: 0.1 10*3/uL (ref 0.0–0.2)
BASO%: 1.4 % (ref 0.0–2.0)
EOS%: 2 % (ref 0.0–7.0)
Eosinophils Absolute: 0.1 10*3/uL (ref 0.0–0.5)
HEMATOCRIT: 35.8 % (ref 34.8–46.6)
HGB: 11.7 g/dL (ref 11.6–15.9)
LYMPH#: 1.3 10*3/uL (ref 0.9–3.3)
LYMPH%: 28.7 % (ref 14.0–48.0)
MCH: 29.8 pg (ref 26.0–34.0)
MCHC: 32.7 g/dL (ref 32.0–36.0)
MCV: 91 fL (ref 81–101)
MONO#: 0.5 10*3/uL (ref 0.1–0.9)
MONO%: 12.2 % (ref 0.0–13.0)
NEUT#: 2.5 10*3/uL (ref 1.5–6.5)
NEUT%: 55.7 % (ref 39.6–80.0)
Platelets: 308 10*3/uL (ref 145–400)
RBC: 3.92 10*6/uL (ref 3.70–5.32)
RDW: 13.1 % (ref 11.1–15.7)
WBC: 4.4 10*3/uL (ref 3.9–10.0)

## 2015-10-12 LAB — COMPREHENSIVE METABOLIC PANEL
ALBUMIN: 3.7 g/dL (ref 3.5–5.0)
ALT: 32 U/L (ref 0–55)
AST: 28 U/L (ref 5–34)
Alkaline Phosphatase: 68 U/L (ref 40–150)
Anion Gap: 8 mEq/L (ref 3–11)
BILIRUBIN TOTAL: 0.41 mg/dL (ref 0.20–1.20)
BUN: 21.6 mg/dL (ref 7.0–26.0)
CO2: 27 mEq/L (ref 22–29)
CREATININE: 0.8 mg/dL (ref 0.6–1.1)
Calcium: 8.9 mg/dL (ref 8.4–10.4)
Chloride: 106 mEq/L (ref 98–109)
EGFR: 74 mL/min/{1.73_m2} — ABNORMAL LOW (ref >90)
GLUCOSE: 93 mg/dL (ref 70–140)
POTASSIUM: 4.8 meq/L (ref 3.5–5.1)
SODIUM: 141 meq/L (ref 136–145)
Total Protein: 6.5 g/dL (ref 6.4–8.3)

## 2015-10-12 NOTE — Progress Notes (Signed)
Hematology and Oncology Follow Up Visit  CAIDYN RUNSER [id number] 03/24/1950 65 y.o. 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  Current Therapy:   Observation.     Interim History:  Ms.  Dillen is in for her yearly followup. She is doing okay. Her husband, is doing well. He had a heart attack about a year and half ago. He is recovered from this totally., He had hernia surgery last year.  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 She has 3 daughters and they are doing well.  She is now retired. She is enjoying retirement. 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  She's had no problems with nausea or vomiting. There's been no bony pain. She has had no change in bowel or bladder habits. She's had no fatigue. There's been no rashes.  She is due for a mammogram in August.  She's had no issues with taking a baby aspirin. She is taking vitamin D. Her vitamin D level when last saw her was 22.  Overall, her performance status is ECOG 0.  Medications:  Current Outpatient Prescriptions:  .  Calcium Carbonate-Vit D-Min (CALCIUM 1200 PO), Take by mouth every morning., Disp: , Rfl:  .  Cyanocobalamin (VITAMIN B-12) 6000 MCG SUBL, Place under the tongue every morning., Disp: , Rfl:  .  l-methylfolate-b2-b6-b12 (CEREFOLIN) 08-16-48-5 MG TABS, Take 1 tablet by mouth daily., Disp: , Rfl:  .  Multiple Vitamin (MULTI-VITAMIN DAILY) TABS, Take by mouth every morning., Disp: , Rfl:  .  Omega-3 Fatty Acids (OMEGA 3 PO), Take by mouth every morning., Disp: , Rfl:  .  venlafaxine XR (EFFEXOR-XR) 75 MG 24 hr capsule, , Disp: , Rfl:   Allergies: No Known Allergies  Past Medical History, Surgical history, Social history, and Family History were reviewed and updated.  Review of Systems: As above  Physical Exam:  weight is 130 lb (59 kg). Her oral temperature is 98.2 F (36.8 C). Her blood pressure is 116/78 and  her pulse is 79. Her respiration is 18.   Well-developed and well-nourished white female in no obvious distress. Head and neck exam shows no ocular or oral lesion. There are no palpable cervical or supraclavicular lymph nodes. Lungs are clear bilaterally. Cardiac exam regular rate and rhythm with no murmurs rubs or bruits. Breast exam shows left breast with no masses edema or erythema. There is no left axillary adenopathy. Right chest wall shows a well-healed mastectomy. No right chest wall nodules are noted. There is no right axillary adenopathy. Back exam no tenderness over the spine. There is no kyphosis. Abdomen is soft. Has good bowel sounds. There is no fluid wave. Extremities shows no clubbing cyanosis or edema. Skin exam no rashes. Neurological exam is nonfocal.  Lab Results  Component Value Date   WBC 4.4 10/12/2015   HGB 11.7 10/12/2015   HCT 35.8 10/12/2015   MCV 91 10/12/2015   PLT 308 10/12/2015     Chemistry      Component Value Date/Time   NA 141 10/12/2014 0950   K 4.4 10/12/2014 0950   CL 99 10/13/2013 0945   CO2 29 10/12/2014 0950   BUN 19.7 10/12/2014 0950   CREATININE 0.8 10/12/2014 0950      Component Value Date/Time   CALCIUM 8.9 10/12/2014 0950   ALKPHOS 64 10/12/2014 0950   AST 34 10/12/2014 0950   ALT 42 10/12/2014 0950  BILITOT 0.38 10/12/2014 0950         Impression and Plan: Ms. Zampino is 65 year old white female with a history of stage IIA ductal carcinoma the right breast. She had a mastectomy. She had one positive lymph node. Her tumor was ER negative. She received adjuvant chemotherapy.  She now is out from treatment by over 18 years. I truly believe that she is cured. She likes to come back to see [place] as she feels confident that we will give her a thorough examination to make sure nothing is happening with her breast cancer.  For now, we will go ahead and plan to get her back in one year.    Volanda Napoleon, MD 7/27/201710:24 AM

## 2015-10-13 ENCOUNTER — Telehealth: Payer: Self-pay

## 2015-10-13 LAB — VITAMIN D 25 HYDROXY (VIT D DEFICIENCY, FRACTURES): VIT D 25 HYDROXY: 39.3 ng/mL (ref 30.0–100.0)

## 2015-10-13 NOTE — Telephone Encounter (Signed)
The following messages left on personalized VM per Dr Antonieta Pert request  "Vit D is ok! Keep taking the vit D!!! 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 [place] job!! pete "  Pt to contact office for questions/concern. dph

## 2015-10-26 ENCOUNTER — Other Ambulatory Visit: Payer: Self-pay | Admitting: Hematology & Oncology

## 2015-10-26 DIAGNOSIS — Z1231 Encounter for screening mammogram for malignant neoplasm of breast: Secondary | ICD-10-CM

## 2015-12-08 ENCOUNTER — Ambulatory Visit
Admission: RE | Admit: 2015-12-08 | Discharge: 2015-12-08 | Disposition: A | Discharge status: Home / Self Care | Payer: PPO | Source: Ambulatory Visit | Attending: Hematology & Oncology | Admitting: Hematology & Oncology

## 2015-12-08 DIAGNOSIS — Z1231 Encounter for screening mammogram for malignant neoplasm of breast: Secondary | ICD-10-CM | POA: Diagnosis not present

## 2016-06-26 DIAGNOSIS — E782 Mixed hyperlipidemia: Secondary | ICD-10-CM | POA: Diagnosis not present

## 2016-06-26 DIAGNOSIS — M797 Fibromyalgia: Secondary | ICD-10-CM | POA: Diagnosis not present

## 2016-06-26 DIAGNOSIS — Z853 Personal history of malignant neoplasm of breast: Secondary | ICD-10-CM | POA: Diagnosis not present

## 2016-06-26 DIAGNOSIS — Z1389 Encounter for screening for other disorder: Secondary | ICD-10-CM | POA: Diagnosis not present

## 2016-06-26 DIAGNOSIS — Z0000 Encounter for general adult medical examination without abnormal findings: Secondary | ICD-10-CM | POA: Diagnosis not present

## 2016-06-26 DIAGNOSIS — Z1211 Encounter for screening for malignant neoplasm of colon: Secondary | ICD-10-CM | POA: Diagnosis not present

## 2016-06-26 DIAGNOSIS — M8580 Other specified disorders of bone density and structure, unspecified site: Secondary | ICD-10-CM | POA: Diagnosis not present

## 2016-07-12 DIAGNOSIS — Z1211 Encounter for screening for malignant neoplasm of colon: Secondary | ICD-10-CM | POA: Diagnosis not present

## 2016-09-26 DIAGNOSIS — D2230 Melanocytic nevi of unspecified part of face: Secondary | ICD-10-CM | POA: Diagnosis not present

## 2016-09-26 DIAGNOSIS — Z86018 Personal history of other benign neoplasm: Secondary | ICD-10-CM | POA: Diagnosis not present

## 2016-09-26 DIAGNOSIS — D2272 Melanocytic nevi of left lower limb, including hip: Secondary | ICD-10-CM | POA: Diagnosis not present

## 2016-09-26 DIAGNOSIS — L821 Other seborrheic keratosis: Secondary | ICD-10-CM | POA: Diagnosis not present

## 2016-09-26 DIAGNOSIS — D225 Melanocytic nevi of trunk: Secondary | ICD-10-CM | POA: Diagnosis not present

## 2016-09-26 DIAGNOSIS — D2372 Other benign neoplasm of skin of left lower limb, including hip: Secondary | ICD-10-CM | POA: Diagnosis not present

## 2016-09-26 DIAGNOSIS — Z85828 Personal history of other malignant neoplasm of skin: Secondary | ICD-10-CM | POA: Diagnosis not present

## 2016-09-26 DIAGNOSIS — L570 Actinic keratosis: Secondary | ICD-10-CM | POA: Diagnosis not present

## 2016-10-08 ENCOUNTER — Other Ambulatory Visit: Payer: PPO

## 2016-10-08 ENCOUNTER — Ambulatory Visit: Payer: PPO | Admitting: Hematology & Oncology

## 2016-10-08 ENCOUNTER — Ambulatory Visit (HOSPITAL_BASED_OUTPATIENT_CLINIC_OR_DEPARTMENT_OTHER): Payer: PPO | Admitting: Family

## 2016-10-08 ENCOUNTER — Other Ambulatory Visit (HOSPITAL_BASED_OUTPATIENT_CLINIC_OR_DEPARTMENT_OTHER): Payer: PPO

## 2016-10-08 VITALS — BP 115/74 | HR 82 | Temp 98.5°F | Resp 18 | Wt 131.0 lb

## 2016-10-08 DIAGNOSIS — M818 Other osteoporosis without current pathological fracture: Secondary | ICD-10-CM

## 2016-10-08 DIAGNOSIS — Z853 Personal history of malignant neoplasm of breast: Secondary | ICD-10-CM | POA: Diagnosis not present

## 2016-10-08 DIAGNOSIS — C50911 Malignant neoplasm of unspecified site of right female breast: Secondary | ICD-10-CM | POA: Diagnosis not present

## 2016-10-08 DIAGNOSIS — T386X5A Adverse effect of antigonadotrophins, antiestrogens, antiandrogens, not elsewhere classified, initial encounter: Secondary | ICD-10-CM | POA: Diagnosis not present

## 2016-10-08 DIAGNOSIS — Z171 Estrogen receptor negative status [ER-]: Secondary | ICD-10-CM | POA: Diagnosis not present

## 2016-10-08 DIAGNOSIS — C50011 Malignant neoplasm of nipple and areola, right female breast: Secondary | ICD-10-CM

## 2016-10-08 LAB — CBC WITH DIFFERENTIAL (CANCER CENTER ONLY)
BASO#: 0 10*3/uL (ref 0.0–0.2)
BASO%: 0.7 % (ref 0.0–2.0)
EOS%: 2.1 % (ref 0.0–7.0)
Eosinophils Absolute: 0.1 10*3/uL (ref 0.0–0.5)
HCT: 40.4 % (ref 34.8–46.6)
HGB: 13.6 g/dL (ref 11.6–15.9)
LYMPH#: 1.4 10*3/uL (ref 0.9–3.3)
LYMPH%: 31.1 % (ref 14.0–48.0)
MCH: 32 pg (ref 26.0–34.0)
MCHC: 33.7 g/dL (ref 32.0–36.0)
MCV: 95 fL (ref 81–101)
MONO#: 0.5 10*3/uL (ref 0.1–0.9)
MONO%: 12.3 % (ref 0.0–13.0)
NEUT#: 2.4 10*3/uL (ref 1.5–6.5)
NEUT%: 53.8 % (ref 39.6–80.0)
PLATELETS: 285 10*3/uL (ref 145–400)
RBC: 4.25 10*6/uL (ref 3.70–5.32)
RDW: 11.8 % (ref 11.1–15.7)
WBC: 4.4 10*3/uL (ref 3.9–10.0)

## 2016-10-08 LAB — CMP (CANCER CENTER ONLY)
ALK PHOS: 56 U/L (ref 26–84)
ALT: 39 U/L (ref 10–47)
AST: 42 U/L — AB (ref 11–38)
Albumin: 3.6 g/dL (ref 3.3–5.5)
BILIRUBIN TOTAL: 0.8 mg/dL (ref 0.20–1.60)
BUN: 18 mg/dL (ref 7–22)
CO2: 30 mEq/L (ref 18–33)
CREATININE: 0.9 mg/dL (ref 0.6–1.2)
Calcium: 9.1 mg/dL (ref 8.0–10.3)
Chloride: 102 mEq/L (ref 98–108)
GLUCOSE: 97 mg/dL (ref 73–118)
POTASSIUM: 4.6 meq/L (ref 3.3–4.7)
Sodium: 141 mEq/L (ref 128–145)
TOTAL PROTEIN: 6.5 g/dL (ref 6.4–8.1)

## 2016-10-08 NOTE — Progress Notes (Signed)
Hematology and Oncology Follow Up Visit  Rebekah Merritt [id number] Jul 23, 1950 66 y.o. 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 Dottavio is here today for her annual follow-up. She is doing well and has no complaints at this time.  She is staying busy keeping her grandchildren and enjoying her new puppy.  She verbalized that she is taking her vitamin D and calcium daily.  Her mammogram in September was negative. Her breast exam today was negative. Her right breast mastectomy site was intact. No changes with the left breast.  No mass, lesion or rash noted. No lymphadenopathy present.  No c/o fatigue. No fever, chills, n/v, cough, rash, dizziness, SOB, chest pain, palpitations, abdominal pain or changes in bowel or bladder habits.  No swelling, tenderness, numbness or tingling in her extremities. No c/o pain at this time.  She has maintained a good appetite and is staying well hydrated. Her weight is stable.   ECOG Performance Status: 0 - Asymptomatic  Medications:  Allergies as of 10/08/2016   No Known Allergies     Medication List       Accurate as of 10/08/16 10:27 AM. Always use your most recent med list.          CALCIUM 1200 PO Take by mouth every morning.   l-methylfolate-b2-b6-b12 08-16-48-5 MG Tabs Commonly known as:  CEREFOLIN Take 1 tablet by mouth daily.   MULTI-VITAMIN DAILY Tabs Take by mouth every morning.   OMEGA 3 PO Take by mouth every morning.   venlafaxine XR 75 MG 24 hr capsule Commonly known as:  EFFEXOR-XR   Vitamin B-12 6000 MCG Subl Place under the tongue every morning.       Allergies: No Known Allergies  Past Medical History, Surgical history, Social history, and Family History were reviewed and updated.  Review of Systems: All other 10 point review of systems is negative.   Physical Exam:  weight is 131 lb (59.4  kg). Her oral temperature is 98.5 F (36.9 C). Her blood pressure is 115/74 and her pulse is 82. Her respiration is 18 and oxygen saturation is 99%.   Wt Readings from Last 3 Encounters:  10/08/16 131 lb (59.4 kg)  10/12/15 130 lb (59 kg)  10/12/14 122 lb (55.3 kg)    Ocular: Sclerae unicteric, pupils equal, round and reactive to light Ear-nose-throat: Oropharynx clear, dentition fair Lymphatic: No cervical, supraclavicular or axillary adenopathy Lungs no rales or rhonchi, good excursion bilaterally Heart regular rate and rhythm, no murmur appreciated Abd soft, nontender, positive bowel sounds, no liver or spleen tip palpated on exam, no fluid wave MSK no focal spinal tenderness, no joint edema Neuro: non-focal, well-oriented, appropriate affect Breasts: Right mastectomy intact, no changes with left breast. No mass, lesion or rash noted on exam.   Lab Results  Component Value Date   WBC 4.4 10/08/2016   HGB 13.6 10/08/2016   HCT 40.4 10/08/2016   MCV 95 10/08/2016   PLT 285 10/08/2016   No results found for: FERRITIN, IRON, TIBC, UIBC, IRONPCTSAT Lab Results  Component Value Date   RBC 4.25 10/08/2016   No results found for: KPAFRELGTCHN, LAMBDASER, KAPLAMBRATIO No results found for: IGGSERUM, IGA, IGMSERUM No results found for: Ronnald Ramp, A1GS, A2GS, Violet Baldy, MSPIKE, SPEI   Chemistry      Component Value Date/Time   NA 141 10/12/2015 0858  K 4.8 10/12/2015 0858   CL 99 10/13/2013 0945   CO2 27 10/12/2015 0858   BUN 21.6 10/12/2015 0858   CREATININE 0.8 10/12/2015 0858      Component Value Date/Time   CALCIUM 8.9 10/12/2015 0858   ALKPHOS 68 10/12/2015 0858   AST 28 10/12/2015 0858   ALT 32 10/12/2015 0858   BILITOT 0.41 10/12/2015 0858      Impression and Plan: Ms. Rivere is a very pleasant 67 yo caucasian female with history of stage IIA ductal carcinoma the right breast, ER negative and or positive lymph node. She completed  treatment with adjuvant chemotherapy over 19 years ago.  She continues to do well and so far there has been no evidence of recurrence.  We will continue to follow along with her and see her back again in 1 year.  She will scheduled her mammogram in September.  Over 50% of the 15 minute face to face appointment was spent counseling and coordinating her care.  She will contact our office with any questions or concerns. We can certainly see her sooner if need be.   Eliezer Bottom, NP 7/24/201810:27 AM

## 2016-10-09 LAB — VITAMIN D 25 HYDROXY (VIT D DEFICIENCY, FRACTURES): Vitamin D, 25-Hydroxy: 38.5 ng/mL (ref 30.0–100.0)

## 2016-10-10 ENCOUNTER — Telehealth: Payer: Self-pay | Admitting: *Deleted

## 2016-10-10 NOTE — Telephone Encounter (Addendum)
Patient is aware of results.   ----- Message from Volanda Napoleon, MD sent at 10/09/2016  5:54 AM EDT ----- Call - vit D level is ok!!! pete

## 2016-10-29 DIAGNOSIS — M8589 Other specified disorders of bone density and structure, multiple sites: Secondary | ICD-10-CM | POA: Diagnosis not present

## 2016-12-10 DIAGNOSIS — Z853 Personal history of malignant neoplasm of breast: Secondary | ICD-10-CM | POA: Diagnosis not present

## 2016-12-10 DIAGNOSIS — Z1231 Encounter for screening mammogram for malignant neoplasm of breast: Secondary | ICD-10-CM | POA: Diagnosis not present

## 2017-06-10 DIAGNOSIS — M5022 Other cervical disc displacement, mid-cervical region, unspecified level: Secondary | ICD-10-CM | POA: Diagnosis not present

## 2017-06-10 DIAGNOSIS — M47814 Spondylosis without myelopathy or radiculopathy, thoracic region: Secondary | ICD-10-CM | POA: Diagnosis not present

## 2017-06-10 DIAGNOSIS — M9901 Segmental and somatic dysfunction of cervical region: Secondary | ICD-10-CM | POA: Diagnosis not present

## 2017-06-10 DIAGNOSIS — M9902 Segmental and somatic dysfunction of thoracic region: Secondary | ICD-10-CM | POA: Diagnosis not present

## 2017-06-16 DIAGNOSIS — M5022 Other cervical disc displacement, mid-cervical region, unspecified level: Secondary | ICD-10-CM | POA: Diagnosis not present

## 2017-06-16 DIAGNOSIS — M47814 Spondylosis without myelopathy or radiculopathy, thoracic region: Secondary | ICD-10-CM | POA: Diagnosis not present

## 2017-06-16 DIAGNOSIS — M9901 Segmental and somatic dysfunction of cervical region: Secondary | ICD-10-CM | POA: Diagnosis not present

## 2017-06-16 DIAGNOSIS — M9902 Segmental and somatic dysfunction of thoracic region: Secondary | ICD-10-CM | POA: Diagnosis not present

## 2017-06-18 DIAGNOSIS — M9901 Segmental and somatic dysfunction of cervical region: Secondary | ICD-10-CM | POA: Diagnosis not present

## 2017-06-18 DIAGNOSIS — M5022 Other cervical disc displacement, mid-cervical region, unspecified level: Secondary | ICD-10-CM | POA: Diagnosis not present

## 2017-06-18 DIAGNOSIS — M47814 Spondylosis without myelopathy or radiculopathy, thoracic region: Secondary | ICD-10-CM | POA: Diagnosis not present

## 2017-06-18 DIAGNOSIS — M9902 Segmental and somatic dysfunction of thoracic region: Secondary | ICD-10-CM | POA: Diagnosis not present

## 2017-06-23 DIAGNOSIS — M9902 Segmental and somatic dysfunction of thoracic region: Secondary | ICD-10-CM | POA: Diagnosis not present

## 2017-06-23 DIAGNOSIS — M5022 Other cervical disc displacement, mid-cervical region, unspecified level: Secondary | ICD-10-CM | POA: Diagnosis not present

## 2017-06-23 DIAGNOSIS — M47814 Spondylosis without myelopathy or radiculopathy, thoracic region: Secondary | ICD-10-CM | POA: Diagnosis not present

## 2017-06-23 DIAGNOSIS — M9901 Segmental and somatic dysfunction of cervical region: Secondary | ICD-10-CM | POA: Diagnosis not present

## 2017-06-25 DIAGNOSIS — M47814 Spondylosis without myelopathy or radiculopathy, thoracic region: Secondary | ICD-10-CM | POA: Diagnosis not present

## 2017-06-25 DIAGNOSIS — M5022 Other cervical disc displacement, mid-cervical region, unspecified level: Secondary | ICD-10-CM | POA: Diagnosis not present

## 2017-06-25 DIAGNOSIS — M9901 Segmental and somatic dysfunction of cervical region: Secondary | ICD-10-CM | POA: Diagnosis not present

## 2017-06-25 DIAGNOSIS — M9902 Segmental and somatic dysfunction of thoracic region: Secondary | ICD-10-CM | POA: Diagnosis not present

## 2017-06-27 DIAGNOSIS — Z1211 Encounter for screening for malignant neoplasm of colon: Secondary | ICD-10-CM | POA: Diagnosis not present

## 2017-06-27 DIAGNOSIS — M8588 Other specified disorders of bone density and structure, other site: Secondary | ICD-10-CM | POA: Diagnosis not present

## 2017-06-27 DIAGNOSIS — E782 Mixed hyperlipidemia: Secondary | ICD-10-CM | POA: Diagnosis not present

## 2017-06-27 DIAGNOSIS — Z0000 Encounter for general adult medical examination without abnormal findings: Secondary | ICD-10-CM | POA: Diagnosis not present

## 2017-06-27 DIAGNOSIS — Z853 Personal history of malignant neoplasm of breast: Secondary | ICD-10-CM | POA: Diagnosis not present

## 2017-06-27 DIAGNOSIS — H9190 Unspecified hearing loss, unspecified ear: Secondary | ICD-10-CM | POA: Diagnosis not present

## 2017-06-27 DIAGNOSIS — Z1389 Encounter for screening for other disorder: Secondary | ICD-10-CM | POA: Diagnosis not present

## 2017-06-27 DIAGNOSIS — M797 Fibromyalgia: Secondary | ICD-10-CM | POA: Diagnosis not present

## 2017-06-30 DIAGNOSIS — M47814 Spondylosis without myelopathy or radiculopathy, thoracic region: Secondary | ICD-10-CM | POA: Diagnosis not present

## 2017-06-30 DIAGNOSIS — M9902 Segmental and somatic dysfunction of thoracic region: Secondary | ICD-10-CM | POA: Diagnosis not present

## 2017-06-30 DIAGNOSIS — M5022 Other cervical disc displacement, mid-cervical region, unspecified level: Secondary | ICD-10-CM | POA: Diagnosis not present

## 2017-06-30 DIAGNOSIS — M9901 Segmental and somatic dysfunction of cervical region: Secondary | ICD-10-CM | POA: Diagnosis not present

## 2017-07-08 DIAGNOSIS — E782 Mixed hyperlipidemia: Secondary | ICD-10-CM | POA: Diagnosis not present

## 2017-07-16 DIAGNOSIS — M9901 Segmental and somatic dysfunction of cervical region: Secondary | ICD-10-CM | POA: Diagnosis not present

## 2017-07-16 DIAGNOSIS — M5022 Other cervical disc displacement, mid-cervical region, unspecified level: Secondary | ICD-10-CM | POA: Diagnosis not present

## 2017-07-16 DIAGNOSIS — M47814 Spondylosis without myelopathy or radiculopathy, thoracic region: Secondary | ICD-10-CM | POA: Diagnosis not present

## 2017-07-16 DIAGNOSIS — M9902 Segmental and somatic dysfunction of thoracic region: Secondary | ICD-10-CM | POA: Diagnosis not present

## 2017-07-30 DIAGNOSIS — M9901 Segmental and somatic dysfunction of cervical region: Secondary | ICD-10-CM | POA: Diagnosis not present

## 2017-07-30 DIAGNOSIS — M47814 Spondylosis without myelopathy or radiculopathy, thoracic region: Secondary | ICD-10-CM | POA: Diagnosis not present

## 2017-07-30 DIAGNOSIS — M5022 Other cervical disc displacement, mid-cervical region, unspecified level: Secondary | ICD-10-CM | POA: Diagnosis not present

## 2017-07-30 DIAGNOSIS — M9902 Segmental and somatic dysfunction of thoracic region: Secondary | ICD-10-CM | POA: Diagnosis not present

## 2017-08-18 ENCOUNTER — Encounter (HOSPITAL_COMMUNITY): Payer: Self-pay

## 2017-08-18 ENCOUNTER — Inpatient Hospital Stay (HOSPITAL_COMMUNITY)
Admission: EM | Admit: 2017-08-18 | Discharge: 2017-08-22 | DRG: 340 | Disposition: A | Discharge status: Home / Self Care | Payer: PPO | Attending: General Surgery | Admitting: General Surgery

## 2017-08-18 DIAGNOSIS — Z79899 Other long term (current) drug therapy: Secondary | ICD-10-CM | POA: Diagnosis not present

## 2017-08-18 DIAGNOSIS — K3532 Acute appendicitis with perforation and localized peritonitis, without abscess: Secondary | ICD-10-CM | POA: Diagnosis present

## 2017-08-18 DIAGNOSIS — Z853 Personal history of malignant neoplasm of breast: Secondary | ICD-10-CM

## 2017-08-18 DIAGNOSIS — R1031 Right lower quadrant pain: Secondary | ICD-10-CM | POA: Diagnosis not present

## 2017-08-18 DIAGNOSIS — K37 Unspecified appendicitis: Secondary | ICD-10-CM | POA: Diagnosis present

## 2017-08-18 DIAGNOSIS — K381 Appendicular concretions: Secondary | ICD-10-CM | POA: Diagnosis present

## 2017-08-18 DIAGNOSIS — R109 Unspecified abdominal pain: Secondary | ICD-10-CM | POA: Diagnosis not present

## 2017-08-18 DIAGNOSIS — K3580 Unspecified acute appendicitis: Secondary | ICD-10-CM

## 2017-08-18 DIAGNOSIS — Z90710 Acquired absence of both cervix and uterus: Secondary | ICD-10-CM | POA: Diagnosis not present

## 2017-08-18 HISTORY — DX: Malignant neoplasm of uterus, part unspecified: C55

## 2017-08-18 HISTORY — DX: Malignant neoplasm of unspecified site of right female breast: C50.911

## 2017-08-18 HISTORY — DX: Reserved for concepts with insufficient information to code with codable children: IMO0002

## 2017-08-18 HISTORY — DX: Unspecified malignant neoplasm of skin, unspecified: C44.90

## 2017-08-18 LAB — URINALYSIS, ROUTINE W REFLEX MICROSCOPIC
BILIRUBIN URINE: NEGATIVE
Glucose, UA: NEGATIVE mg/dL
Hgb urine dipstick: NEGATIVE
KETONES UR: NEGATIVE mg/dL
LEUKOCYTES UA: NEGATIVE
NITRITE: NEGATIVE
PROTEIN: NEGATIVE mg/dL
Specific Gravity, Urine: 1.004 — ABNORMAL LOW (ref 1.005–1.030)
pH: 6 (ref 5.0–8.0)

## 2017-08-18 LAB — CBC
HCT: 39.3 % (ref 36.0–46.0)
Hemoglobin: 13.1 g/dL (ref 12.0–15.0)
MCH: 30.8 pg (ref 26.0–34.0)
MCHC: 33.3 g/dL (ref 30.0–36.0)
MCV: 92.5 fL (ref 78.0–100.0)
PLATELETS: 194 10*3/uL (ref 150–400)
RBC: 4.25 MIL/uL (ref 3.87–5.11)
RDW: 11.6 % (ref 11.5–15.5)
WBC: 10.9 10*3/uL — ABNORMAL HIGH (ref 4.0–10.5)

## 2017-08-18 LAB — COMPREHENSIVE METABOLIC PANEL
ALBUMIN: 3.9 g/dL (ref 3.5–5.0)
ALK PHOS: 47 U/L (ref 38–126)
ALT: 23 U/L (ref 14–54)
AST: 22 U/L (ref 15–41)
Anion gap: 9 (ref 5–15)
BILIRUBIN TOTAL: 0.8 mg/dL (ref 0.3–1.2)
BUN: 11 mg/dL (ref 6–20)
CALCIUM: 9.2 mg/dL (ref 8.9–10.3)
CO2: 27 mmol/L (ref 22–32)
Chloride: 106 mmol/L (ref 101–111)
Creatinine, Ser: 0.82 mg/dL (ref 0.44–1.00)
GFR calc Af Amer: >60 mL/min (ref >60)
GLUCOSE: 109 mg/dL — AB (ref 65–99)
POTASSIUM: 3.5 mmol/L (ref 3.5–5.1)
Sodium: 142 mmol/L (ref 135–145)
TOTAL PROTEIN: 6.7 g/dL (ref 6.5–8.1)

## 2017-08-18 LAB — LIPASE, BLOOD: Lipase: 20 U/L (ref 11–51)

## 2017-08-18 NOTE — ED Triage Notes (Signed)
Pt states that since yesterday she has been having lower abd pain, worse on the R side, denies urinary symptoms, denies n/v/d/fevers.

## 2017-08-19 ENCOUNTER — Observation Stay (HOSPITAL_COMMUNITY): Payer: PPO | Admitting: Anesthesiology

## 2017-08-19 ENCOUNTER — Encounter (HOSPITAL_COMMUNITY): Payer: Self-pay | Admitting: Anesthesiology

## 2017-08-19 ENCOUNTER — Encounter (HOSPITAL_COMMUNITY): Admission: EM | Disposition: A | Discharge status: Home / Self Care | Payer: Self-pay | Source: Home / Self Care

## 2017-08-19 ENCOUNTER — Emergency Department (HOSPITAL_COMMUNITY): Payer: PPO

## 2017-08-19 ENCOUNTER — Other Ambulatory Visit: Payer: Self-pay

## 2017-08-19 DIAGNOSIS — K37 Unspecified appendicitis: Secondary | ICD-10-CM | POA: Diagnosis present

## 2017-08-19 DIAGNOSIS — K3580 Unspecified acute appendicitis: Secondary | ICD-10-CM | POA: Diagnosis not present

## 2017-08-19 HISTORY — PX: APPENDECTOMY: SHX54

## 2017-08-19 HISTORY — PX: LAPAROSCOPIC APPENDECTOMY: SHX408

## 2017-08-19 SURGERY — APPENDECTOMY, LAPAROSCOPIC
Anesthesia: General | Site: Abdomen

## 2017-08-19 MED ORDER — ONDANSETRON HCL 4 MG/2ML IJ SOLN
4.0000 mg | Freq: Once | INTRAMUSCULAR | Status: AC
  Administered 2017-08-19: 4 mg via INTRAVENOUS
  Filled 2017-08-19 (×2): qty 2

## 2017-08-19 MED ORDER — IOHEXOL 300 MG/ML  SOLN
100.0000 mL | Freq: Once | INTRAMUSCULAR | Status: AC | PRN
  Administered 2017-08-19: 100 mL via INTRAVENOUS

## 2017-08-19 MED ORDER — FENTANYL CITRATE (PF) 100 MCG/2ML IJ SOLN: 25.0000 ug | INTRAMUSCULAR | Status: DC | PRN

## 2017-08-19 MED ORDER — MORPHINE SULFATE (PF) 4 MG/ML IV SOLN
4.0000 mg | Freq: Once | INTRAVENOUS | Status: AC
  Administered 2017-08-19: 2 mg via INTRAVENOUS
  Filled 2017-08-19 (×2): qty 1

## 2017-08-19 MED ORDER — FENTANYL CITRATE (PF) 250 MCG/5ML IJ SOLN
INTRAMUSCULAR | Status: DC | PRN
  Administered 2017-08-19: 50 ug via INTRAVENOUS
  Administered 2017-08-19: 25 ug via INTRAVENOUS
  Administered 2017-08-19: 50 ug via INTRAVENOUS
  Administered 2017-08-19 (×3): 25 ug via INTRAVENOUS
  Administered 2017-08-19: 50 ug via INTRAVENOUS

## 2017-08-19 MED ORDER — SUCCINYLCHOLINE CHLORIDE 200 MG/10ML IV SOSY
PREFILLED_SYRINGE | INTRAVENOUS | Status: AC
  Filled 2017-08-19: qty 10

## 2017-08-19 MED ORDER — SUGAMMADEX SODIUM 200 MG/2ML IV SOLN
INTRAVENOUS | Status: DC | PRN
  Administered 2017-08-19: 140 mg via INTRAVENOUS

## 2017-08-19 MED ORDER — VENLAFAXINE HCL 25 MG PO TABS
25.0000 mg | ORAL_TABLET | Freq: Every day | ORAL | Status: DC
  Administered 2017-08-19 – 2017-08-21 (×3): 25 mg via ORAL
  Filled 2017-08-19 (×4): qty 1

## 2017-08-19 MED ORDER — DEXAMETHASONE SODIUM PHOSPHATE 10 MG/ML IJ SOLN
INTRAMUSCULAR | Status: DC | PRN
  Administered 2017-08-19: 10 mg via INTRAVENOUS

## 2017-08-19 MED ORDER — DIPHENHYDRAMINE HCL 12.5 MG/5ML PO ELIX: 12.5000 mg | ORAL_SOLUTION | Freq: Four times a day (QID) | ORAL | Status: DC | PRN

## 2017-08-19 MED ORDER — FENTANYL CITRATE (PF) 250 MCG/5ML IJ SOLN
INTRAMUSCULAR | Status: AC
  Filled 2017-08-19: qty 5

## 2017-08-19 MED ORDER — OXYCODONE HCL 5 MG PO TABS: 5.0000 mg | ORAL_TABLET | Freq: Once | ORAL | Status: DC | PRN

## 2017-08-19 MED ORDER — STERILE WATER FOR IRRIGATION IR SOLN
Status: DC | PRN
  Administered 2017-08-19: 200 mL

## 2017-08-19 MED ORDER — POTASSIUM CHLORIDE IN NACL 20-0.9 MEQ/L-% IV SOLN
INTRAVENOUS | Status: DC
  Administered 2017-08-19 – 2017-08-20 (×4): via INTRAVENOUS
  Filled 2017-08-19 (×4): qty 1000

## 2017-08-19 MED ORDER — DEXAMETHASONE SODIUM PHOSPHATE 10 MG/ML IJ SOLN
INTRAMUSCULAR | Status: AC
  Filled 2017-08-19: qty 1

## 2017-08-19 MED ORDER — FENTANYL CITRATE (PF) 100 MCG/2ML IJ SOLN
100.0000 ug | Freq: Once | INTRAMUSCULAR | Status: DC
  Filled 2017-08-19: qty 2

## 2017-08-19 MED ORDER — SODIUM CHLORIDE 0.9 % IV SOLN
1.0000 g | Freq: Once | INTRAVENOUS | Status: AC
  Administered 2017-08-19: 1 g via INTRAVENOUS
  Filled 2017-08-19: qty 10

## 2017-08-19 MED ORDER — ACETAMINOPHEN 325 MG PO TABS
650.0000 mg | ORAL_TABLET | Freq: Four times a day (QID) | ORAL | Status: DC
  Administered 2017-08-19 – 2017-08-21 (×9): 650 mg via ORAL
  Filled 2017-08-19 (×10): qty 2

## 2017-08-19 MED ORDER — SUCCINYLCHOLINE CHLORIDE 20 MG/ML IJ SOLN: INTRAMUSCULAR | Status: DC | PRN

## 2017-08-19 MED ORDER — METRONIDAZOLE IN NACL 5-0.79 MG/ML-% IV SOLN
500.0000 mg | Freq: Once | INTRAVENOUS | Status: AC
  Administered 2017-08-19: 500 mg via INTRAVENOUS
  Filled 2017-08-19: qty 100

## 2017-08-19 MED ORDER — 0.9 % SODIUM CHLORIDE (POUR BTL) OPTIME
TOPICAL | Status: DC | PRN
  Administered 2017-08-19: 1000 mL

## 2017-08-19 MED ORDER — SUGAMMADEX SODIUM 200 MG/2ML IV SOLN
INTRAVENOUS | Status: AC
  Filled 2017-08-19: qty 2

## 2017-08-19 MED ORDER — LIDOCAINE 2% (20 MG/ML) 5 ML SYRINGE
INTRAMUSCULAR | Status: DC | PRN
  Administered 2017-08-19: 20 mg via INTRAVENOUS

## 2017-08-19 MED ORDER — OXYCODONE HCL 5 MG PO TABS: 5.0000 mg | ORAL_TABLET | ORAL | Status: DC | PRN

## 2017-08-19 MED ORDER — ROCURONIUM BROMIDE 10 MG/ML (PF) SYRINGE
PREFILLED_SYRINGE | INTRAVENOUS | Status: DC | PRN
  Administered 2017-08-19: 30 mg via INTRAVENOUS

## 2017-08-19 MED ORDER — SODIUM CHLORIDE 0.9 % IR SOLN
Status: DC | PRN
  Administered 2017-08-19: 1000 mL

## 2017-08-19 MED ORDER — ROCURONIUM BROMIDE 10 MG/ML (PF) SYRINGE
PREFILLED_SYRINGE | INTRAVENOUS | Status: AC
  Filled 2017-08-19: qty 5

## 2017-08-19 MED ORDER — PROPOFOL 10 MG/ML IV BOLUS
INTRAVENOUS | Status: DC | PRN
  Administered 2017-08-19: 120 mg via INTRAVENOUS

## 2017-08-19 MED ORDER — PROPOFOL 10 MG/ML IV BOLUS
INTRAVENOUS | Status: AC
  Filled 2017-08-19: qty 20

## 2017-08-19 MED ORDER — MORPHINE SULFATE (PF) 2 MG/ML IV SOLN
1.0000 mg | INTRAVENOUS | Status: DC | PRN
  Administered 2017-08-19: 2 mg via INTRAVENOUS
  Filled 2017-08-19: qty 1

## 2017-08-19 MED ORDER — ONDANSETRON HCL 4 MG/2ML IJ SOLN
INTRAMUSCULAR | Status: DC | PRN
  Administered 2017-08-19: 4 mg via INTRAVENOUS

## 2017-08-19 MED ORDER — SUCCINYLCHOLINE CHLORIDE 200 MG/10ML IV SOSY
PREFILLED_SYRINGE | INTRAVENOUS | Status: DC | PRN
  Administered 2017-08-19: 100 mg via INTRAVENOUS

## 2017-08-19 MED ORDER — LIDOCAINE 2% (20 MG/ML) 5 ML SYRINGE
INTRAMUSCULAR | Status: AC
  Filled 2017-08-19: qty 5

## 2017-08-19 MED ORDER — MORPHINE SULFATE (PF) 2 MG/ML IV SOLN: 2.0000 mg | INTRAVENOUS | Status: DC | PRN

## 2017-08-19 MED ORDER — ONDANSETRON HCL 4 MG/2ML IJ SOLN
4.0000 mg | Freq: Four times a day (QID) | INTRAMUSCULAR | Status: DC | PRN
  Administered 2017-08-19 – 2017-08-20 (×2): 4 mg via INTRAVENOUS
  Filled 2017-08-19 (×2): qty 2

## 2017-08-19 MED ORDER — DIPHENHYDRAMINE HCL 50 MG/ML IJ SOLN: 12.5000 mg | Freq: Four times a day (QID) | INTRAMUSCULAR | Status: DC | PRN

## 2017-08-19 MED ORDER — ONDANSETRON HCL 4 MG/2ML IJ SOLN
INTRAMUSCULAR | Status: AC
  Filled 2017-08-19: qty 2

## 2017-08-19 MED ORDER — BUPIVACAINE-EPINEPHRINE (PF) 0.25% -1:200000 IJ SOLN
INTRAMUSCULAR | Status: AC
  Filled 2017-08-19: qty 30

## 2017-08-19 MED ORDER — ONDANSETRON 4 MG PO TBDP
4.0000 mg | ORAL_TABLET | Freq: Four times a day (QID) | ORAL | Status: DC | PRN
  Filled 2017-08-19: qty 1

## 2017-08-19 MED ORDER — OXYCODONE HCL 5 MG/5ML PO SOLN: 5.0000 mg | Freq: Once | ORAL | Status: DC | PRN

## 2017-08-19 MED ORDER — LACTATED RINGERS IV SOLN
INTRAVENOUS | Status: DC
  Administered 2017-08-19 (×2): via INTRAVENOUS

## 2017-08-19 MED ORDER — ONDANSETRON HCL 4 MG/2ML IJ SOLN: 4.0000 mg | Freq: Four times a day (QID) | INTRAMUSCULAR | Status: DC | PRN

## 2017-08-19 MED ORDER — BUPIVACAINE-EPINEPHRINE 0.25% -1:200000 IJ SOLN
INTRAMUSCULAR | Status: DC | PRN
  Administered 2017-08-19: 10 mL

## 2017-08-19 SURGICAL SUPPLY — 44 items
APPLIER CLIP 5 13 M/L LIGAMAX5 (MISCELLANEOUS)
BIOPATCH RED 1 DISK 7.0 (GAUZE/BANDAGES/DRESSINGS) ×2 IMPLANT
CANISTER SUCT 3000ML PPV (MISCELLANEOUS) ×2 IMPLANT
CHLORAPREP W/TINT 26ML (MISCELLANEOUS) ×2 IMPLANT
CLIP APPLIE 5 13 M/L LIGAMAX5 (MISCELLANEOUS) IMPLANT
CLIP VESOLOCK XL 6/CT (CLIP) ×4 IMPLANT
COVER SURGICAL LIGHT HANDLE (MISCELLANEOUS) ×2 IMPLANT
CUTTER FLEX LINEAR 45M (STAPLE) ×2 IMPLANT
DERMABOND ADVANCED (GAUZE/BANDAGES/DRESSINGS) ×1
DERMABOND ADVANCED .7 DNX12 (GAUZE/BANDAGES/DRESSINGS) ×1 IMPLANT
DRAIN CHANNEL 19F RND (DRAIN) ×2 IMPLANT
DRSG TEGADERM 4X4.75 (GAUZE/BANDAGES/DRESSINGS) ×2 IMPLANT
ELECT REM PT RETURN 9FT ADLT (ELECTROSURGICAL) ×2
ELECTRODE REM PT RTRN 9FT ADLT (ELECTROSURGICAL) ×1 IMPLANT
ENDOLOOP SUT PDS II  0 18 (SUTURE)
ENDOLOOP SUT PDS II 0 18 (SUTURE) IMPLANT
EVACUATOR SILICONE 100CC (DRAIN) ×2 IMPLANT
GLOVE BIOGEL PI IND STRL 7.0 (GLOVE) ×1 IMPLANT
GLOVE BIOGEL PI INDICATOR 7.0 (GLOVE) ×1
GLOVE SURG SS PI 7.0 STRL IVOR (GLOVE) ×2 IMPLANT
GOWN STRL REUS W/ TWL LRG LVL3 (GOWN DISPOSABLE) ×3 IMPLANT
GOWN STRL REUS W/TWL LRG LVL3 (GOWN DISPOSABLE) ×3
GRASPER SUT TROCAR 14GX15 (MISCELLANEOUS) ×2 IMPLANT
KIT BASIN OR (CUSTOM PROCEDURE TRAY) ×2 IMPLANT
KIT TURNOVER KIT B (KITS) ×2 IMPLANT
NEEDLE 22X1 1/2 (OR ONLY) (NEEDLE) ×2 IMPLANT
NS IRRIG 1000ML POUR BTL (IV SOLUTION) ×2 IMPLANT
PAD ARMBOARD 7.5X6 YLW CONV (MISCELLANEOUS) ×4 IMPLANT
POUCH RETRIEVAL ECOSAC 10 (ENDOMECHANICALS) ×1 IMPLANT
POUCH RETRIEVAL ECOSAC 10MM (ENDOMECHANICALS) ×1
RELOAD STAPLE TA45 3.5 REG BLU (ENDOMECHANICALS) ×4 IMPLANT
SCISSORS LAP 5X35 DISP (ENDOMECHANICALS) ×2 IMPLANT
SET IRRIG TUBING LAPAROSCOPIC (IRRIGATION / IRRIGATOR) ×2 IMPLANT
SPECIMEN JAR SMALL (MISCELLANEOUS) ×2 IMPLANT
SUT ETHILON 2 0 FS 18 (SUTURE) ×2 IMPLANT
SUT MNCRL AB 4-0 PS2 18 (SUTURE) ×2 IMPLANT
TOWEL OR 17X24 6PK STRL BLUE (TOWEL DISPOSABLE) ×2 IMPLANT
TOWEL OR 17X26 10 PK STRL BLUE (TOWEL DISPOSABLE) ×2 IMPLANT
TRAY LAPAROSCOPIC MC (CUSTOM PROCEDURE TRAY) ×2 IMPLANT
TROCAR BLADELESS 12MM (ENDOMECHANICALS) ×2 IMPLANT
TROCAR XCEL NON-BLD 11X100MML (ENDOMECHANICALS) ×2 IMPLANT
TROCAR XCEL NON-BLD 5MMX100MML (ENDOMECHANICALS) ×4 IMPLANT
TUBING INSUFFLATION (TUBING) ×2 IMPLANT
WATER STERILE IRR 1000ML POUR (IV SOLUTION) ×2 IMPLANT

## 2017-08-19 NOTE — H&P (Signed)
Rebekah Merritt is an 67 y.o. female.   Chief Complaint: RLQ abdominal pain HPI: This is a pleasant 67 year old female who presented to the emergency department with lower abdominal pain.  The pain started yesterday and then moved to the right lower quadrant.  She underwent a CT scan of her abdomen and pelvis showing appendicitis with a appendicolith.  She describes the pain is mild to moderate in intensity in the right lower quadrant and as a dull ache.  She has no nausea or vomiting.  Bowel movements have been normal.  She only has a slight decrease appetite  Past Medical History:  Diagnosis Date  . Cancer [facility])    breast     Past Surgical History:  Procedure Laterality Date  . ABDOMINAL HYSTERECTOMY      No family history on file. Social History:  reports that she has never smoked. She has never used smokeless tobacco. She reports that she drinks about 0.6 oz of alcohol per week. She reports that she does not use drugs.  Allergies: No Known Allergies   (Not in a hospital admission)  Results for orders placed or performed during the hospital encounter of 08/18/17 (from the past 48 hour(s))  Lipase, blood     Status: None   Collection Time: 08/18/17  9:46 PM  Result Value Ref Range   Lipase 20 11 - 51 U/L    Comment: Performed at [facility], [address], [place], [place] [address]  Comprehensive metabolic panel     Status: Abnormal   Collection Time: 08/18/17  9:46 PM  Result Value Ref Range   Sodium 142 135 - 145 mmol/L   Potassium 3.5 3.5 - 5.1 mmol/L   Chloride 106 101 - 111 mmol/L   CO2 27 22 - 32 mmol/L   Glucose, Bld 109 (H) 65 - 99 mg/dL   BUN 11 6 - 20 mg/dL   Creatinine, Ser 0.82 0.44 - 1.00 mg/dL   Calcium 9.2 8.9 - 10.3 mg/dL   Total Protein 6.7 6.5 - 8.1 g/dL   Albumin 3.9 3.5 - 5.0 g/dL   AST 22 15 - 41 U/L   ALT 23 14 - 54 U/L   Alkaline Phosphatase 47 38 - 126 U/L   Total Bilirubin 0.8 0.3 - 1.2 mg/dL   GFR calc non Af Amer >60 >60 mL/min    GFR calc Af Amer >60 >60 mL/min    Comment: (NOTE) The eGFR has been calculated using the CKD EPI equation. This calculation has not been validated in all clinical situations. eGFR's persistently <60 mL/min signify possible Chronic Kidney Disease.    Anion gap 9 5 - 15    Comment: Performed at [facility] [address], [place], [place] [address]  CBC     Status: Abnormal   Collection Time: 08/18/17  9:46 PM  Result Value Ref Range   WBC 10.9 (H) 4.0 - 10.5 K/uL    Comment: WHITE COUNT CONFIRMED ON SMEAR   RBC 4.25 3.87 - 5.11 MIL/uL   Hemoglobin 13.1 12.0 - 15.0 g/dL   HCT 39.3 36.0 - 46.0 %   MCV 92.5 78.0 - 100.0 fL   MCH 30.8 26.0 - 34.0 pg   MCHC 33.3 30.0 - 36.0 g/dL   RDW 11.6 11.5 - 15.5 %   Platelets 194 150 - 400 K/uL    Comment: PLATELET COUNT CONFIRMED BY SMEAR Performed at [facility], [place] [address], [place], [place]  [address]   Urinalysis, Routine w reflex microscopic     Status: Abnormal   Collection Time: 08/18/17  9:48 PM  Result Value Ref Range   Color, Urine STRAW (A) YELLOW   APPearance CLEAR CLEAR   Specific Gravity, Urine 1.004 (L) 1.005 - 1.030   pH 6.0 5.0 - 8.0   Glucose, UA NEGATIVE NEGATIVE mg/dL   Hgb urine dipstick NEGATIVE NEGATIVE   Bilirubin Urine NEGATIVE NEGATIVE   Ketones, ur NEGATIVE NEGATIVE mg/dL   Protein, ur NEGATIVE NEGATIVE mg/dL   Nitrite NEGATIVE NEGATIVE   Leukocytes, UA NEGATIVE NEGATIVE    Comment: Performed at [facility] [address], [place], [place] [address]   Ct Abdomen Pelvis W Contrast  Result Date: 08/19/2017 CLINICAL DATA:  Acute onset of generalized abdominal pain. EXAM: CT ABDOMEN AND PELVIS WITH CONTRAST TECHNIQUE: Multidetector CT imaging of the abdomen and pelvis was performed using the standard protocol following bolus administration of intravenous contrast. CONTRAST:  117m OMNIPAQUE IOHEXOL 300 MG/ML  SOLN COMPARISON:  None. FINDINGS: Lower chest: Minimal atelectasis or scarring is  noted at the lung bases. The visualized portions of the mediastinum are unremarkable. Hepatobiliary: The liver is unremarkable in appearance. The gallbladder is unremarkable in appearance. The common bile duct remains normal in caliber. Pancreas: The pancreas is within normal limits. Spleen: The spleen is unremarkable in appearance. Adrenals/Urinary Tract: The adrenal glands are unremarkable in appearance. A small right renal cyst is noted. The kidneys are otherwise unremarkable. There is no evidence of hydronephrosis. No renal or ureteral stones are identified. No perinephric stranding is seen. Stomach/Bowel: There is diffuse dilatation of the appendix to 1.4 cm in maximal diameter, with a 1.4 cm appendicolith noted at the base of the appendix. The appendix is retrocecal in nature. Surrounding soft tissue inflammation and trace free fluid are noted. There is no definite evidence of perforation or abscess formation at this time. There is associated mucosal edema at the cecum. The remainder of the colon is seen unremarkable in appearance. The small bowel is grossly unremarkable. The stomach is decompressed and within normal limits. Vascular/Lymphatic: The abdominal aorta is unremarkable in appearance. The inferior vena cava is grossly unremarkable. No retroperitoneal lymphadenopathy is seen. No pelvic sidewall lymphadenopathy is identified. Reproductive: The bladder is mildly distended and grossly unremarkable. The patient is status post hysterectomy. No suspicious adnexal masses are seen. Other: No additional soft tissue abnormalities are seen. Musculoskeletal: No acute osseous abnormalities are identified. The visualized musculature is unremarkable in appearance. IMPRESSION: 1. Acute appendicitis, with diffuse dilatation of the appendix to 1.4 cm in maximal diameter, and a 1.4 cm appendicolith at the base of the appendix. The appendix is retrocecal in nature. Surrounding soft tissue inflammation and trace free  fluid noted. No definite evidence of perforation or abscess formation at this time. Associated mucosal edema noted at the cecum. 2. Small right renal cyst noted. These results were called by telephone at the time of interpretation on 08/19/2017 at 4:48 am to Dr. CThayer Jew who verbally acknowledged these results. Electronically Signed   By: JGarald BaldingM.D.   On: 08/19/2017 04:49    Review of Systems  Constitutional: Negative for chills and fever.  Respiratory: Negative for cough and shortness of breath.   Cardiovascular: Negative for chest pain.  Gastrointestinal: Positive for abdominal pain. Negative for vomiting.  Genitourinary: Negative for dysuria.  All other systems reviewed and are negative.   Blood pressure 121/75, pulse 76, temperature 99 F (37.2 C), temperature  source Oral, resp. rate 18, SpO2 99 %. Physical Exam  Constitutional: She is oriented to person, place, and time. She appears well-developed and well-nourished. No distress.  HENT:  Head: Normocephalic and atraumatic.  Right Ear: External ear normal.  Left Ear: External ear normal.  Nose: Nose normal.  Mouth/Throat: No oropharyngeal exudate.  Eyes: Pupils are equal, round, and reactive to light. Right eye exhibits no discharge. Left eye exhibits no discharge. No scleral icterus.  Neck: Normal range of motion. No tracheal deviation present.  Cardiovascular: Normal rate, regular rhythm and normal heart sounds.  No murmur heard. Respiratory: Effort normal and breath sounds normal. No respiratory distress.  Right mastectomy  GI: Soft. There is tenderness.  There is mild tenderness with some guarding in the right lower quadrant.  There are no hernias  Musculoskeletal: Normal range of motion. She exhibits no edema, tenderness or deformity.  Neurological: She is alert and oriented to person, place, and time.  Skin: Skin is warm. She is not diaphoretic. No erythema.  Psychiatric: Her behavior is normal. Judgment  normal.     Assessment/Plan Acute appendicitis  I discussed the diagnosis with the patient and her husband.  I discussed surgical removal of the appendix.  We discussed  laparoscopic appendectomy.  She has already received IV antibiotics.  She will remain NPO.  I will notify our acute care surgeon of her admission and he will discuss surgery further with her.  Harl Bowie, MD 08/19/2017, 6:15 AM

## 2017-08-19 NOTE — ED Notes (Signed)
Report to Lindsey RN.

## 2017-08-19 NOTE — ED Provider Notes (Signed)
[facility] EMERGENCY DEPARTMENT Provider Note   CSN: [id number] Arrival date & time: 08/18/17  2120     History   Chief Complaint Chief Complaint  Patient presents with  . Abdominal Pain    HPI DERRICKA MERTZ is a 67 y.o. female.  HPI  This is a 67 year old female with a history of breast cancer who presents with abdominal pain.  Patient reports she had onset of diffuse lower abdominal pain yesterday.  It was nagging in nature.  Nonradiating.  It since has migrated to the right lower quadrant.  She currently rates her pain at 4 out of 10.  She states it hurts also when she touches her left side of her abdomen.  She denies any nausea or vomiting.  She has not eaten much today.  She denies any urinary symptoms including dysuria or hematuria.  She still has her appendix and her gallbladder.  Past Medical History:  Diagnosis Date  . Cancer [facility])    breast     There are no active problems to display for this patient.   Past Surgical History:  Procedure Laterality Date  . ABDOMINAL HYSTERECTOMY       OB History   None      Home Medications    Prior to Admission medications   Medication Sig Start Date End Date Taking? Authorizing Provider  Calcium Carbonate-Vit D-Min (CALCIUM 1200 PO) Take 1 tablet by mouth daily.    Yes [provider]  Cyanocobalamin (VITAMIN B-12) 6000 MCG SUBL Place 1 tablet under the tongue daily.    Yes [provider]  Multiple Vitamin (MULTI-VITAMIN DAILY) TABS Take 1 tablet by mouth daily.    Yes [provider]  venlafaxine (EFFEXOR) 25 MG tablet Take 25 mg by mouth daily.   Yes [provider]    Family History No family history on file.  Social History Social History   Tobacco Use  . Smoking status: Never Smoker  . Smokeless tobacco: Never Used  Substance Use Topics  . Alcohol use: Yes    Alcohol/week: 0.6 oz    Types: 1 Glasses of wine per week  . Drug use: Never      Allergies   Patient has no known allergies.   Review of Systems Review of Systems  Constitutional: Negative for fever.  Respiratory: Negative for shortness of breath.   Cardiovascular: Negative for chest pain.  Gastrointestinal: Positive for abdominal pain. Negative for constipation, diarrhea, nausea and vomiting.  Genitourinary: Negative for dysuria and hematuria.  All other systems reviewed and are negative.    Physical Exam Updated Vital Signs BP 121/75   Pulse 76   Temp 99 F (37.2 C) (Oral)   Resp 18   SpO2 99%   Physical Exam  Constitutional: She is oriented to person, place, and time. She appears well-developed and well-nourished.  Appears younger than stated age  HENT:  Head: Normocephalic and atraumatic.  Eyes: Pupils are equal, round, and reactive to light.  Neck: Neck supple.  Cardiovascular: Normal rate, regular rhythm and normal heart sounds.  Pulmonary/Chest: Effort normal. No respiratory distress. She has no wheezes.  Abdominal: Soft. Bowel sounds are normal. There is tenderness in the right lower quadrant. There is rebound. There is negative Murphy's sign.  Positive Rovsing's  Neurological: She is alert and oriented to person, place, and time.  Skin: Skin is warm and dry.  Psychiatric: She has a normal mood and affect.  Nursing note and vitals reviewed.  ED Treatments / Results  Labs (all labs ordered are listed, but only abnormal results are displayed) Labs Reviewed  COMPREHENSIVE METABOLIC PANEL - Abnormal; Notable for the following components:      Result Value   Glucose, Bld 109 (*)    All other components within normal limits  CBC - Abnormal; Notable for the following components:   WBC 10.9 (*)    All other components within normal limits  URINALYSIS, ROUTINE W REFLEX MICROSCOPIC - Abnormal; Notable for the following components:   Color, Urine STRAW (*)    Specific Gravity, Urine 1.004 (*)    All other components within normal  limits  LIPASE, BLOOD    EKG None  Radiology Ct Abdomen Pelvis W Contrast  Result Date: 08/19/2017 CLINICAL DATA:  Acute onset of generalized abdominal pain. EXAM: CT ABDOMEN AND PELVIS WITH CONTRAST TECHNIQUE: Multidetector CT imaging of the abdomen and pelvis was performed using the standard protocol following bolus administration of intravenous contrast. CONTRAST:  154mL OMNIPAQUE IOHEXOL 300 MG/ML  SOLN COMPARISON:  None. FINDINGS: Lower chest: Minimal atelectasis or scarring is noted at the lung bases. The visualized portions of the mediastinum are unremarkable. Hepatobiliary: The liver is unremarkable in appearance. The gallbladder is unremarkable in appearance. The common bile duct remains normal in caliber. Pancreas: The pancreas is within normal limits. Spleen: The spleen is unremarkable in appearance. Adrenals/Urinary Tract: The adrenal glands are unremarkable in appearance. A small right renal cyst is noted. The kidneys are otherwise unremarkable. There is no evidence of hydronephrosis. No renal or ureteral stones are identified. No perinephric stranding is seen. Stomach/Bowel: There is diffuse dilatation of the appendix to 1.4 cm in maximal diameter, with a 1.4 cm appendicolith noted at the base of the appendix. The appendix is retrocecal in nature. Surrounding soft tissue inflammation and trace free fluid are noted. There is no definite evidence of perforation or abscess formation at this time. There is associated mucosal edema at the cecum. The remainder of the colon is seen unremarkable in appearance. The small bowel is grossly unremarkable. The stomach is decompressed and within normal limits. Vascular/Lymphatic: The abdominal aorta is unremarkable in appearance. The inferior vena cava is grossly unremarkable. No retroperitoneal lymphadenopathy is seen. No pelvic sidewall lymphadenopathy is identified. Reproductive: The bladder is mildly distended and grossly unremarkable. The patient is  status post hysterectomy. No suspicious adnexal masses are seen. Other: No additional soft tissue abnormalities are seen. Musculoskeletal: No acute osseous abnormalities are identified. The visualized musculature is unremarkable in appearance. IMPRESSION: 1. Acute appendicitis, with diffuse dilatation of the appendix to 1.4 cm in maximal diameter, and a 1.4 cm appendicolith at the base of the appendix. The appendix is retrocecal in nature. Surrounding soft tissue inflammation and trace free fluid noted. No definite evidence of perforation or abscess formation at this time. Associated mucosal edema noted at the cecum. 2. Small right renal cyst noted. These results were called by telephone at the time of interpretation on 08/19/2017 at 4:48 am to Dr. Thayer Jew, who verbally acknowledged these results. Electronically Signed   By: Garald Balding M.D.   On: 08/19/2017 04:49    Procedures Procedures (including critical care time)  Medications Ordered in ED Medications  cefTRIAXone (ROCEPHIN) 1 g in sodium chloride 0.9 % 100 mL IVPB (1 g Intravenous New Bag/Given 08/19/17 0518)  metroNIDAZOLE (FLAGYL) IVPB 500 mg (has no administration in time range)  morphine 4 MG/ML injection 4 mg (2 mg Intravenous Given 08/19/17 0517)  ondansetron (ZOFRAN)  injection 4 mg (4 mg Intravenous Given 08/19/17 0517)  iohexol (OMNIPAQUE) 300 MG/ML solution 100 mL (100 mLs Intravenous Contrast Given 08/19/17 0434)     Initial Impression / Assessment and Plan / ED Course  I have reviewed the triage vital signs and the nursing notes.  Pertinent labs & imaging results that were available during my care of the patient were reviewed by me and considered in my medical decision making (see chart for details).     Patient presents with progressively worsening abdominal pain.  Now localizes to the right lower quadrant.  She is overall nontoxic and afebrile.  She does have a mild leukocytosis.  She has evidence of tenderness and some  mild peritonitis with right lower quadrant palpation as well as a positive Rovsing's.  Highly suspicious for appendicitis.  Patient was offered pain and nausea medication.  She initially declined.  CT scan does confirm acute appendicitis without abscess.  Patient was given Rocephin and Flagyl.  Discussed with Dr. Ninfa Linden.  Patient will be evaluated by general surgery.  Final Clinical Impressions(s) / ED Diagnoses   Final diagnoses:  Acute appendicitis, unspecified acute appendicitis type    ED Discharge Orders    None       Merryl Hacker, MD 08/19/17 [id number]

## 2017-08-19 NOTE — Progress Notes (Signed)
Received pt from PACU. Patient alert and oriented. Complaining of pain. Will continue to monitor.

## 2017-08-19 NOTE — Progress Notes (Signed)
Pre Procedure note for inpatients:   Rebekah Merritt has been scheduled for Procedure(s): APPENDECTOMY LAPAROSCOPIC (N/A) today. The various methods of treatment have been discussed with the patient. After consideration of the risks, benefits and treatment options the patient has consented to the planned procedure.   The patient has been seen and labs reviewed. There are no changes in the patient's condition to prevent proceeding with the planned procedure today.  Recent labs:  Lab Results  Component Value Date   WBC 10.9 (H) 08/18/2017   HGB 13.1 08/18/2017   HCT 39.3 08/18/2017   PLT 194 08/18/2017   GLUCOSE 109 (H) 08/18/2017   ALT 23 08/18/2017   AST 22 08/18/2017   NA 142 08/18/2017   K 3.5 08/18/2017   CL 106 08/18/2017   CREATININE 0.82 08/18/2017   BUN 11 08/18/2017   CO2 27 08/18/2017    Mickeal Skinner, MD 08/19/2017 8:28 AM

## 2017-08-19 NOTE — Anesthesia Preprocedure Evaluation (Signed)
Anesthesia Evaluation  Patient identified by MRN, date of birth, ID band Patient awake    Reviewed: Allergy & Precautions, H&P , NPO status , Patient's Chart, lab work & pertinent test results  Airway Mallampati: II   Neck ROM: full    Dental   Pulmonary neg pulmonary ROS,    breath sounds clear to auscultation       Cardiovascular negative cardio ROS   Rhythm:regular Rate:Normal     Neuro/Psych    GI/Hepatic appendicitis   Endo/Other    Renal/GU      Musculoskeletal   Abdominal   Peds  Hematology   Anesthesia Other Findings   Reproductive/Obstetrics H/o breast CA                             Anesthesia Physical Anesthesia Plan  ASA: II  Anesthesia Plan: General   Post-op Pain Management:    Induction: Intravenous  PONV Risk Score and Plan: 3 and Ondansetron, Dexamethasone, Treatment may vary due to age or medical condition and Midazolam  Airway Management Planned: Oral ETT  Additional Equipment:   Intra-op Plan:   Post-operative Plan:   Informed Consent: I have reviewed the patients History and Physical, chart, labs and discussed the procedure including the risks, benefits and alternatives for the proposed anesthesia with the patient or authorized representative who has indicated his/her understanding and acceptance.     Plan Discussed with: CRNA, Anesthesiologist and Surgeon  Anesthesia Plan Comments:         Anesthesia Quick Evaluation

## 2017-08-19 NOTE — Op Note (Signed)
Preoperative diagnosis: acute appendicitis  Postoperative diagnosis: acute appendicitis with perforation  Procedure: laparoscopic appendectomy  Surgeon: Gurney Maxin, M.D.  Asst: Neena Rhymes  Anesthesia: Gen.   Indications for procedure: Rebekah Merritt is a 67 y.o. female with symptoms of pain in right lower quadrant consistent with acute appendicitis. Confirmed by CT scan and laboratory values.  Description of procedure: The patient was brought into the operative suite, placed supine. Anesthesia was administered with endotracheal tube. The patient's left arm was tucked. All pressure points were offloaded by foam padding. The patient was prepped and draped in the usual sterile fashion.  A transverse incision was made to the left of the umbilicus and a 34mm trocar was [place]. Pneumoperitoneum was applied with high flow low pressure.  2 71mm trocars were placed, one in the suprapubic space, one in the LLQ, the periumbilical incision was then up-sized and a 93mm trocar placed in that space. All trocars sites were first anesthesized with Marcaine. Next the patient was placed in trendelenberg, rotated to the left. The omentum was retracted cephalad. The cecum and appendix were identified. The appendix base had a small amount of purulent drainage from a small rupture. There was minimal purulence in the abdominal cavity. The base of the appendix was dissected and a window through the mesoappendix was created with blunt dissection. Initially, hemolock clips were placed on the appendiceal artery and base, however, on further examination there was necrosis and perforation proximal to the clip. Therefore, additional dissection was performed and 2 78mm blue load stapler loads were used to come across the cecum to remove the ruptured base of the appendix.  The appendix was placed in a specimen bag. The pelvis and RLQ were irrigated. No purulence was seen in the pelvis. The appendix was removed via the  umbilicus. A drain was placed in the right lower quadrant and brought through the suprapubic incision. 0 vicryl was used to close the fascial defect. Pneumoperitoneum was removed, all trocars were removed. All incisions were closed with 4-0 monocryl subcuticular stitch. The patient woke from anesthesia and was brought to PACU in stable condition.  Findings: perforated appendicitis  Specimen: appendix  Blood loss: 30 ml  Local anesthesia: 20 ml marcaine  Complications: none  Images:       Gurney Maxin, M.D. General, Bariatric, & Minimally Invasive Surgery [facility]

## 2017-08-19 NOTE — Anesthesia Procedure Notes (Signed)
Procedure Name: Intubation Date/Time: 08/19/2017 8:59 AM Performed by: Renato Shin, CRNA Pre-anesthesia Checklist: Patient identified, Emergency Drugs available, Suction available and Patient being monitored Patient Re-evaluated:Patient Re-evaluated prior to induction Oxygen Delivery Method: Circle system utilized Preoxygenation: Pre-oxygenation with 100% oxygen Induction Type: IV induction, Rapid sequence and Cricoid Pressure applied Laryngoscope Size: Miller and 2 Grade View: Grade I Tube type: Oral Tube size: 7.0 mm Number of attempts: 1 Airway Equipment and Method: Stylet Placement Confirmation: ETT inserted through vocal cords under direct vision,  positive ETCO2,  CO2 detector and breath sounds checked- equal and bilateral Secured at: 20 cm Tube secured with: Tape Dental Injury: Teeth and Oropharynx as per pre-operative assessment

## 2017-08-19 NOTE — Transfer of Care (Signed)
Immediate Anesthesia Transfer of Care Note  Patient: Rebekah Merritt  Procedure(s) Performed: APPENDECTOMY LAPAROSCOPIC (N/A Abdomen)  Patient Location: PACU  Anesthesia Type:General  Level of Consciousness: awake, alert , oriented and patient cooperative  Airway & Oxygen Therapy: Patient Spontanous Breathing and Patient connected to nasal cannula oxygen  Post-op Assessment: Report given to RN and Post -op Vital signs reviewed and stable  Post vital signs: Reviewed and stable  Last Vitals:  Vitals Value Taken Time  BP 132/76 08/19/2017 10:13 AM  Temp    Pulse 94 08/19/2017 10:16 AM  Resp 23 08/19/2017 10:16 AM  SpO2 97 % 08/19/2017 10:16 AM  Vitals shown include unvalidated device data.  Last Pain:  Vitals:   08/19/17 0654  TempSrc:   PainSc: 3          Complications: No apparent anesthesia complications

## 2017-08-20 ENCOUNTER — Encounter (HOSPITAL_COMMUNITY): Payer: Self-pay | Admitting: General Surgery

## 2017-08-20 DIAGNOSIS — K381 Appendicular concretions: Secondary | ICD-10-CM | POA: Diagnosis present

## 2017-08-20 DIAGNOSIS — Z853 Personal history of malignant neoplasm of breast: Secondary | ICD-10-CM | POA: Diagnosis not present

## 2017-08-20 DIAGNOSIS — K3532 Acute appendicitis with perforation and localized peritonitis, without abscess: Secondary | ICD-10-CM | POA: Diagnosis present

## 2017-08-20 DIAGNOSIS — Z79899 Other long term (current) drug therapy: Secondary | ICD-10-CM | POA: Diagnosis not present

## 2017-08-20 DIAGNOSIS — K3580 Unspecified acute appendicitis: Secondary | ICD-10-CM | POA: Diagnosis present

## 2017-08-20 DIAGNOSIS — Z90710 Acquired absence of both cervix and uterus: Secondary | ICD-10-CM | POA: Diagnosis not present

## 2017-08-20 LAB — HIV ANTIBODY (ROUTINE TESTING W REFLEX): HIV SCREEN 4TH GENERATION: NONREACTIVE

## 2017-08-20 LAB — CBC WITH DIFFERENTIAL/PLATELET
BLASTS: 0 %
Band Neutrophils: 4 %
Basophils Absolute: 0 10*3/uL (ref 0.0–0.1)
Basophils Relative: 0 %
Eosinophils Absolute: 0 10*3/uL (ref 0.0–0.7)
Eosinophils Relative: 0 %
HEMATOCRIT: 35.8 % — AB (ref 36.0–46.0)
HEMOGLOBIN: 11.8 g/dL — AB (ref 12.0–15.0)
LYMPHS PCT: 7 %
Lymphs Abs: 0.8 10*3/uL (ref 0.7–4.0)
MCH: 31.3 pg (ref 26.0–34.0)
MCHC: 33 g/dL (ref 30.0–36.0)
MCV: 95 fL (ref 78.0–100.0)
Metamyelocytes Relative: 0 %
Monocytes Absolute: 0.3 10*3/uL (ref 0.1–1.0)
Monocytes Relative: 3 %
Myelocytes: 0 %
NEUTROS PCT: 86 %
NRBC: 0 /100{WBCs}
Neutro Abs: 9.6 10*3/uL — ABNORMAL HIGH (ref 1.7–7.7)
OTHER: 0 %
PROMYELOCYTES RELATIVE: 0 %
Platelets: UNDETERMINED 10*3/uL (ref 150–400)
RBC: 3.77 MIL/uL — ABNORMAL LOW (ref 3.87–5.11)
RDW: 12 % (ref 11.5–15.5)
WBC: 10.7 10*3/uL — ABNORMAL HIGH (ref 4.0–10.5)

## 2017-08-20 MED ORDER — METRONIDAZOLE IN NACL 5-0.79 MG/ML-% IV SOLN
500.0000 mg | Freq: Three times a day (TID) | INTRAVENOUS | Status: DC
  Administered 2017-08-20 – 2017-08-21 (×5): 500 mg via INTRAVENOUS
  Filled 2017-08-20 (×8): qty 100

## 2017-08-20 MED ORDER — SIMETHICONE 80 MG PO CHEW
80.0000 mg | CHEWABLE_TABLET | Freq: Four times a day (QID) | ORAL | Status: DC | PRN
  Administered 2017-08-20: 80 mg via ORAL
  Filled 2017-08-20: qty 1

## 2017-08-20 MED ORDER — CEFTRIAXONE SODIUM 2 G IJ SOLR
2.0000 g | INTRAMUSCULAR | Status: DC
  Administered 2017-08-20 – 2017-08-21 (×2): 2 g via INTRAVENOUS
  Filled 2017-08-20 (×3): qty 20

## 2017-08-20 NOTE — Anesthesia Postprocedure Evaluation (Signed)
Anesthesia Post Note  Patient: [name]  Procedure(s) Performed: APPENDECTOMY LAPAROSCOPIC (N/A Abdomen)     Patient location during evaluation: PACU Anesthesia Type: General Level of consciousness: awake and alert Pain management: pain level controlled Vital Signs Assessment: post-procedure vital signs reviewed and stable Respiratory status: spontaneous breathing, nonlabored ventilation, respiratory function stable and patient connected to nasal cannula oxygen Cardiovascular status: blood pressure returned to baseline and stable Postop Assessment: no apparent nausea or vomiting Anesthetic complications: no    Last Vitals:  Vitals:   08/20/17 0151 08/20/17 0459  BP: 114/64 111/64  Pulse: 65 66  Resp: 16 16  Temp: 36.6 C 36.8 C  SpO2: 97% 95%    Last Pain:  Vitals:   08/20/17 0459  TempSrc: Oral  PainSc:                  [place]

## 2017-08-20 NOTE — Progress Notes (Signed)
  Progress Note: General Surgery Service   Assessment/Plan: Patient Active Problem List   Diagnosis Date Noted  . Appendicitis 08/19/2017   s/p Procedure(s): APPENDECTOMY LAPAROSCOPIC 08/19/2017 Vomited yesterday, moderate pain, ambulating well -f/u labs -continue abx    LOS: 0 days  Chief Complaint/Subjective: Moderate pain, vomited yesterday after drinking, no nausea today  Objective: Vital signs in last 24 hours: Temp:  [97.9 F (36.6 C)-98.7 F (37.1 C)] 98.2 F (36.8 C) (06/05 0459) Pulse Rate:  [65-93] 66 (06/05 0459) Resp:  [14-18] 16 (06/05 0459) BP: (111-132)/(64-76) 111/64 (06/05 0459) SpO2:  [95 %-98 %] 95 % (06/05 0459) Weight:  [56.7 kg (125 lb 1.6 oz)] 56.7 kg (125 lb 1.6 oz) (06/04 1113) Last BM Date: 08/16/17  Intake/Output from previous day: 06/04 0701 - 06/05 0700 In: 2572.2 [P.O.:820; I.V.:1752.2] Out: 215 [Drains:190; Blood:25] Intake/Output this shift: No intake/output data recorded.  Lungs: CTAB  Cardiovascular: RRR  Abd: soft, ATTP, nondistended  Extremities: no edema  Neuro: AOx4  Lab Results: CBC  Recent Labs    08/18/17 2146  WBC 10.9*  HGB 13.1  HCT 39.3  PLT 194   BMET Recent Labs    08/18/17 2146  NA 142  K 3.5  CL 106  CO2 27  GLUCOSE 109*  BUN 11  CREATININE 0.82  CALCIUM 9.2   PT/INR No results for input(s): LABPROT, INR in the last 72 hours. ABG No results for input(s): PHART, HCO3 in the last 72 hours.  Invalid input(s): PCO2, PO2  Studies/Results:  Anti-infectives: Anti-infectives (From admission, onward)   Start     Dose/Rate Route Frequency Ordered Stop   08/19/17 0500  cefTRIAXone (ROCEPHIN) 1 g in sodium chloride 0.9 % 100 mL IVPB     1 g 200 mL/hr over 30 Minutes Intravenous  Once 08/19/17 0453 08/19/17 0548   08/19/17 0500  metroNIDAZOLE (FLAGYL) IVPB 500 mg     500 mg 100 mL/hr over 60 Minutes Intravenous  Once 08/19/17 0453 08/19/17 0600      Medications: Scheduled Meds: .  acetaminophen  650 mg Oral Q6H  . venlafaxine  25 mg Oral Daily   Continuous Infusions: . 0.9 % NaCl with KCl 20 mEq / L 100 mL/hr at 08/19/17 2139   PRN Meds:.diphenhydrAMINE **OR** diphenhydrAMINE, morphine injection, ondansetron **OR** ondansetron (ZOFRAN) IV, oxyCODONE  Mickeal Skinner, MD Pg# [phone] [facility]

## 2017-08-21 LAB — CBC
HCT: 40 % (ref 36.0–46.0)
HEMOGLOBIN: 13 g/dL (ref 12.0–15.0)
MCH: 31 pg (ref 26.0–34.0)
MCHC: 32.5 g/dL (ref 30.0–36.0)
MCV: 95.5 fL (ref 78.0–100.0)
Platelets: UNDETERMINED 10*3/uL (ref 150–400)
RBC: 4.19 MIL/uL (ref 3.87–5.11)
RDW: 12.1 % (ref 11.5–15.5)
WBC: 8.1 10*3/uL (ref 4.0–10.5)

## 2017-08-21 MED ORDER — SODIUM CHLORIDE 0.9% FLUSH: 3.0000 mL | INTRAVENOUS | Status: DC | PRN

## 2017-08-21 MED ORDER — SODIUM CHLORIDE 0.9 % IV SOLN: 250.0000 mL | INTRAVENOUS | Status: DC | PRN

## 2017-08-21 MED ORDER — SODIUM CHLORIDE 0.9% FLUSH
3.0000 mL | Freq: Two times a day (BID) | INTRAVENOUS | Status: DC
  Administered 2017-08-21: 3 mL via INTRAVENOUS

## 2017-08-21 NOTE — Progress Notes (Signed)
Patient ID: Rebekah Merritt, female   DOB: 09/05/1950, 67 y.o.   MRN: [id number]    2 Days Post-Op  Subjective: Patient feeling better today.  + flatus.  Tolerating a solid diet, but drain output has picked up this morning.  Objective: Vital signs in last 24 hours: Temp:  [98.3 F (36.8 C)-99 F (37.2 C)] 99 F (37.2 C) (06/06 0618) Pulse Rate:  [66-74] 73 (06/06 0618) Resp:  [16] 16 (06/06 0618) BP: (121-129)/(74-90) 121/90 (06/06 0618) SpO2:  [96 %-99 %] 96 % (06/06 0618) Last BM Date: 08/16/17  Intake/Output from previous day: 06/05 0701 - 06/06 0700 In: 1020 [P.O.:1020] Out: 330 [Drains:330] Intake/Output this shift: No intake/output data recorded.  PE: Abd: soft, appropriately tender, +BS, incisions are c/d/i with some ecchymosis present around umbilical incision.  RLQ JP drain with about 140cc so far this morning.  It has changed in a matter of minutes from serosang to just serous fluid.    Lab Results:  Recent Labs    08/20/17 0757 08/21/17 0722  WBC 10.7* 8.1  HGB 11.8* 13.0  HCT 35.8* 40.0  PLT PLATELET CLUMPS NOTED ON SMEAR, UNABLE TO ESTIMATE PLATELET CLUMPS NOTED ON SMEAR, UNABLE TO ESTIMATE   BMET Recent Labs    08/18/17 2146  NA 142  K 3.5  CL 106  CO2 27  GLUCOSE 109*  BUN 11  CREATININE 0.82  CALCIUM 9.2   PT/INR No results for input(s): LABPROT, INR in the last 72 hours. CMP     Component Value Date/Time   NA 142 08/18/2017 2146   NA 141 10/08/2016 0956   NA 141 10/12/2015 0858   K 3.5 08/18/2017 2146   K 4.6 10/08/2016 0956   K 4.8 10/12/2015 0858   CL 106 08/18/2017 2146   CL 102 10/08/2016 0956   CO2 27 08/18/2017 2146   CO2 30 10/08/2016 0956   CO2 27 10/12/2015 0858   GLUCOSE 109 (H) 08/18/2017 2146   GLUCOSE 97 10/08/2016 0956   BUN 11 08/18/2017 2146   BUN 18 10/08/2016 0956   BUN 21.6 10/12/2015 0858   CREATININE 0.82 08/18/2017 2146   CREATININE 0.9 10/08/2016 0956   CREATININE 0.8 10/12/2015 0858   CALCIUM  9.2 08/18/2017 2146   CALCIUM 9.1 10/08/2016 0956   CALCIUM 8.9 10/12/2015 0858   PROT 6.7 08/18/2017 2146   PROT 6.5 10/08/2016 0956   PROT 6.5 10/12/2015 0858   ALBUMIN 3.9 08/18/2017 2146   ALBUMIN 3.6 10/08/2016 0956   ALBUMIN 3.7 10/12/2015 0858   AST 22 08/18/2017 2146   AST 42 (H) 10/08/2016 0956   AST 28 10/12/2015 0858   ALT 23 08/18/2017 2146   ALT 39 10/08/2016 0956   ALT 32 10/12/2015 0858   ALKPHOS 47 08/18/2017 2146   ALKPHOS 56 10/08/2016 0956   ALKPHOS 68 10/12/2015 0858   BILITOT 0.8 08/18/2017 2146   BILITOT 0.80 10/08/2016 0956   BILITOT 0.41 10/12/2015 0858   GFRNONAA >60 08/18/2017 2146   GFRAA >60 08/18/2017 2146   Lipase     Component Value Date/Time   LIPASE 20 08/18/2017 2146       Studies/Results: No results found.  Anti-infectives: Anti-infectives (From admission, onward)   Start     Dose/Rate Route Frequency Ordered Stop   08/20/17 1100  cefTRIAXone (ROCEPHIN) 2 g in sodium chloride 0.9 % 100 mL IVPB     2 g 200 mL/hr over 30 Minutes Intravenous Every 24 hours 08/20/17 1017  08/20/17 1100  metroNIDAZOLE (FLAGYL) IVPB 500 mg     500 mg 100 mL/hr over 60 Minutes Intravenous Every 8 hours 08/20/17 1017     08/19/17 0500  cefTRIAXone (ROCEPHIN) 1 g in sodium chloride 0.9 % 100 mL IVPB     1 g 200 mL/hr over 30 Minutes Intravenous  Once 08/19/17 0453 08/19/17 0548   08/19/17 0500  metroNIDAZOLE (FLAGYL) IVPB 500 mg     500 mg 100 mL/hr over 60 Minutes Intravenous  Once 08/19/17 0453 08/19/17 0600       Assessment/Plan Perforated appendicitis, POD 2, s/p lap appy, Dr. Kieth Brightly 08-19-17 -patient is doing well and meets criteria for discharge except her drainage output has dramatically gone up just this morning.  It is clear and serous.  It is not feculent or purulent in nature.  I will recheck her this afternoon to see what her drain is doing to determine if she needs to stay for another day or if she is stable for DC home. -soft  diet -cont abx therapy   FEN - soft VTE - SCDs/Lovenox ID - Rocephin/Flagyl 6/4 -->   LOS: 1 day    Henreitta Cea , [facility] 08/21/2017, 9:54 AM Pager: [phone]

## 2017-08-21 NOTE — Discharge Instructions (Signed)
Surgical Drain Home Care °Surgical drains are used to remove extra fluid that normally builds up in a surgical wound after surgery. A surgical drain helps to heal a surgical wound. Different kinds of surgical drains include: °· Active drains. These drains use suction to pull drainage away from the surgical wound. Drainage flows through a tube to a container outside of the body. It is important to keep the bulb or the drainage container flat (compressed) at all times, except while you empty it. Flattening the bulb or container creates suction. The two most common types of active drains are bulb drains and Hemovac drains. °· Passive drains. These drains allow fluid to drain naturally, by gravity. Drainage flows through a tube to a bandage (dressing) or a container outside of the body. Passive drains do not need to be emptied. The most common type of passive drain is the Penrose drain. ° °A drain is placed during surgery. Immediately after surgery, drainage is usually bright red and a little thicker than water. The drainage may gradually turn yellow or pink and become thinner. It is likely that your health care provider will remove the drain when the drainage stops or when the amount decreases to 1-2 Tbsp (15-30 mL) during a 24-hour period. °How to care for your surgical drain °· Keep the skin around the drain dry and covered with a dressing at all times. °· Check your drain area every day for signs of infection. Check for: °? More redness, swelling, or pain. °? Pus or a bad smell. °? Cloudy drainage. °Follow instructions from your health care provider about how to take care of your drain and how to change your dressing. Change your dressing at least one time every day. Change it more often if needed to keep the dressing dry. Make sure you: °1. Gather your supplies, including: °? Tape. °? Germ-free cleaning solution (sterile saline). °? Split gauze drain sponge: 4 x 4 inches (10 x 10 cm). °? Gauze square: 4 x 4 inches  (10 x 10 cm). °2. Wash your hands with soap and water before you change your dressing. If soap and water are not available, use hand sanitizer. °3. Remove the old dressing. Avoid using scissors to do that. °4. Use sterile saline to clean your skin around the drain. °5. Place the tube through the slit in a drain sponge. Place the drain sponge so that it covers your wound. °6. Place the gauze square or another drain sponge on top of the drain sponge that is on the wound. Make sure the tube is between those layers. °7. Tape the dressing to your skin. °8. If you have an active bulb or Hemovac drain, tape the drainage tube to your skin 1-2 inches (2.5-5 cm) below the place where the tube enters your body. Taping keeps the tube from pulling on any stitches (sutures) that you have. °9. Wash your hands with soap and water. °10. Write down the color of your drainage and how often you change your dressing. ° °How to empty your active bulb or Hemovac drain °1. Make sure that you have a measuring cup that you can empty your drainage into. °2. Wash your hands with soap and water. If soap and water are not available, use hand sanitizer. °3. Gently move your fingers down the tube while squeezing very lightly. This is called stripping the tube. This clears any drainage, clots, or tissue from the tube. °? Do not pull on the tube. °? You may need to strip   the tube several times every day to keep the tube clear. 4. Open the bulb cap or the drain plug. Do not touch the inside of the cap or the bottom of the plug. 5. Empty all of the drainage into the measuring cup. 6. Compress the bulb or the container and replace the cap or the plug. To compress the bulb or the container, squeeze it firmly in the middle while you close the cap or plug the container. 7. Write down the amount of drainage that you have in each 24-hour period. If you have less than 2 Tbsp (30 mL) of drainage during 24 hours, contact your health care  provider. 8. Flush the drainage down the toilet. 9. Wash your hands with soap and water. Contact a health care provider if:  You have more redness, swelling, or pain around your drain area.  The amount of drainage that you have is increasing instead of decreasing.  You have pus or a bad smell coming from your drain area.  You have a fever.  You have drainage that is cloudy.  There is a sudden stop or a sudden decrease in the amount of drainage that you have.  Your tube falls out.  Your active draindoes not stay compressedafter you empty it. This information is not intended to replace advice given to you by your health care provider. Make sure you discuss any questions you have with your health care provider. Document Released: 03/01/2000 Document Revised: 08/10/2015 Document Reviewed: 09/21/2014 Elsevier Interactive Patient Education  [employer].  please arrive at least 30 min before your appointment to complete your check in paperwork.  If you are unable to arrive 30 min prior to your appointment time we may have to cancel or reschedule you.  LAPAROSCOPIC SURGERY: POST OP INSTRUCTIONS  1. DIET: Follow a light bland diet the first 24 hours after arrival home, such as soup, liquids, crackers, etc. Be sure to include lots of fluids daily. Avoid fast food or heavy meals as your are more likely to get nauseated. Eat a low fat the next few days after surgery.  2. Take your usually prescribed home medications unless otherwise directed. 3. PAIN CONTROL:  1. Pain is best controlled by a usual combination of three different methods TOGETHER:  i. Ice/Heat ii. Over the counter pain medication iii. Prescription pain medication 2. Most patients will experience some swelling and bruising around the incisions. Ice packs or heating pads (30-60 minutes up to 6 times a day) will help. Use ice for the first few days to help decrease swelling and bruising, then switch to heat to help relax  tight/sore spots and speed recovery. Some people prefer to use ice alone, heat alone, alternating between ice & heat. Experiment to what works for you. Swelling and bruising can take several weeks to resolve.  3. It is helpful to take an over-the-counter pain medication regularly for the first few weeks. Choose one of the following that works best for you:  i. Naproxen (Aleve, etc) Two 220mg  tabs twice a day ii. Ibuprofen (Advil, etc) Three 200mg  tabs four times a day (every meal & bedtime) iii. Acetaminophen (Tylenol, etc) 500-650mg  four times a day (every meal & bedtime) 4. A prescription for pain medication (such as oxycodone, hydrocodone, etc) should be given to you upon discharge. Take your pain medication as prescribed.  i. If you are having problems/concerns with the prescription medicine (does not control pain, nausea, vomiting, rash, itching, etc), please call us [phone]  to see if we need to switch you to a different pain medicine that will work better for you and/or control your side effect better. ii. If you need a refill on your pain medication, please contact your pharmacy. They will contact our office to request authorization. Prescriptions will not be filled after 5 pm or on week-ends. 1. Avoid getting constipated. Between the surgery and the pain medications, it is common to experience some constipation. Increasing fluid intake and taking a fiber supplement (such as Metamucil, Citrucel, FiberCon, MiraLax, etc) 1-2 times a day regularly will usually help prevent this problem from occurring. A mild laxative (prune juice, Milk of Magnesia, MiraLax, etc) should be taken according to package directions if there are no bowel movements after 48 hours.  2. Watch out for diarrhea. If you have many loose bowel movements, simplify your diet to bland foods & liquids for a few days. Stop any stool softeners and decrease your fiber supplement. Switching to mild anti-diarrheal medications  (Kayopectate, Pepto Bismol) can help. If this worsens or does not improve, please call us. 3. Wash / shower every day. You may shower over the dressings as they are waterproof. Continue to shower over incision(s) after the dressing is off. 4. Remove your waterproof bandages 5 days after surgery. You may leave the incision open to air. You may replace a dressing/Band-Aid to cover the incision for comfort if you wish.  5. ACTIVITIES as tolerated:  a. You may resume regular (light) daily activities beginning the next day--such as daily self-care, walking, climbing stairs--gradually increasing activities as tolerated. If you can walk 30 minutes without difficulty, it is safe to try more intense activity such as jogging, treadmill, bicycling, low-impact aerobics, swimming, etc. b. Save the most intensive and strenuous activity for last such as sit-ups, heavy lifting, contact sports, etc Refrain from any heavy lifting or straining until you are off narcotics for pain control.  c. DO NOT PUSH THROUGH PAIN. Let pain be your guide: If it hurts to do something, don't do it. Pain is your body warning you to avoid that activity for another week until the pain goes down. d. You may drive when you are no longer taking prescription pain medication, you can comfortably wear a seatbelt, and you can safely maneuver your car and apply brakes. e. Dennis Bast may have sexual intercourse when it is comfortable.  6. FOLLOW UP in our office  a. Please call [facility] at [phone] to set up an appointment to see your surgeon in the office for a follow-up appointment approximately 2-3 weeks after your surgery. b. Make sure that you call for this appointment the day you arrive home to insure a convenient appointment time.      10. IF YOU HAVE DISABILITY OR FAMILY LEAVE FORMS, BRING THEM TO THE               OFFICE FOR PROCESSING.   WHEN TO CALL us [phone]:  1. Poor pain control 2. Reactions / problems with new medications  (rash/itching, nausea, etc)  3. Fever over 101.5 F (38.5 C) 4. Inability to urinate 5. Nausea and/or vomiting 6. Worsening swelling or bruising 7. Continued bleeding from incision. 8. Increased pain, redness, or drainage from the incision  The clinic staff is available to answer your questions during regular business hours (8:30am-5pm). Please dont hesitate to call and ask to speak to one of our nurses for clinical concerns.  If you have a medical emergency, go to the nearest  emergency room or call 911.  A surgeon from [facility] is always on call at the [facility], [place], [place], [place], [place] [address] ?  MAIN: [phone] ? TOLL FREE: [phone] ?  FAX [phone]  [email or web address]

## 2017-08-21 NOTE — Progress Notes (Addendum)
Spoke to Dr. Kieth Brightly, Can d/c Flagyl after Pt's 7 PM dose.  Pt to be SL through the day and night so she can rest per MD and PA's verbal order to this RN and to Pt during rounds. Kelly and Dr. Kieth Brightly would like the Pt to be able to rest this night without interruptions, unless the Pt has needs that she will call out for, including VS not to be taken after 10 PM until 6 AM, as long as the Pt is stable and no issues  - Per Saverio Danker, PA.

## 2017-08-22 MED ORDER — AMOXICILLIN-POT CLAVULANATE 875-125 MG PO TABS
1.0000 | ORAL_TABLET | Freq: Two times a day (BID) | ORAL | 0 refills | Status: AC
Start: 2017-08-22 — End: 2017-08-27

## 2017-08-22 MED ORDER — ALUM & MAG HYDROXIDE-SIMETH 200-200-20 MG/5ML PO SUSP
15.0000 mL | Freq: Once | ORAL | Status: AC
  Administered 2017-08-22: 15 mL via ORAL
  Filled 2017-08-22: qty 30

## 2017-08-22 NOTE — Progress Notes (Signed)
Pt to be discharged home today. Discharge instructions reviewed with Pt and her husband with Doroteo Glassman, PA.  Awaiting final discharge orders from [place].

## 2017-08-22 NOTE — Care Management Important Message (Signed)
Important Message  Patient Details  Name: Rebekah Merritt MRN: [id number] Date of Birth: 12-12-1950   Medicare Important Message Given:  Yes    Orbie Pyo 08/22/2017, 4:19 PM

## 2017-08-22 NOTE — Discharge Summary (Signed)
     Patient ID: Rebekah Merritt [id number] 09-Oct-1950 67 y.o.  Admit date: 08/18/2017 Discharge date: 08/22/2017  Admitting Diagnosis: Appendicitis   Discharge Diagnosis Patient Active Problem List   Diagnosis Date Noted  . Acute perforated Appendicitis 08/19/2017    Consultants none  Reason for Admission: This is a pleasant 67 year old female who presented to the emergency department with lower abdominal pain.  The pain started yesterday and then moved to the right lower quadrant.  She underwent a CT scan of her abdomen and pelvis showing appendicitis with a appendicolith.  She describes the pain is mild to moderate in intensity in the right lower quadrant and as a dull ache.  She has no nausea or vomiting.  Bowel movements have been normal.  She only has a slight decrease appetite  Procedures Laparoscopic appendectomy with drain placement on 08/19/17, Dr. Carlus Pavlov Course:  The patient was admitted and placed on abx therapy.  She was taken to the OR where she was found to have a perforation of her appendix.  A drain was left in place at the time of surgery.  Post operatively, her diet was able to be advanced as tolerated without issue as she regained bowel function.  She was on a soft diet by POD 2.  Her JP drain had minimal output initially, but on POD 2, increased to over 400cc.  This was serous.  Her WBC was normal.  She was afebrile and otherwise had no complaints.  She was watched for an additional night.  Her output decreased to 140cc overnight.  It remained clear and serous.  She was stable for DC home on POD 3 with her drain in place.  She will follow up mid next week for drain check and suspected removal.  Drain care was taught to the patient and her husband.      Physical Exam: Abd: soft, appropriately tender, JP with clear serous output, +BS, incisions c/d/i  Allergies as of 08/22/2017   No Known Allergies     Medication List    TAKE these medications     amoxicillin-clavulanate 875-125 MG tablet Commonly known as:  AUGMENTIN Take 1 tablet by mouth 2 (two) times daily for 5 days.   CALCIUM 1200 PO Take 1 tablet by mouth daily.   MULTI-VITAMIN DAILY Tabs Take 1 tablet by mouth daily.   venlafaxine 25 MG tablet Commonly known as:  EFFEXOR Take 25 mg by mouth daily.   Vitamin B-12 6000 MCG Subl Place 1 tablet under the tongue daily.        Follow-up Information    [facility] [place] Follow up.   Specialty:  General Surgery Why:  RN only visit for drain removal, our office will call you Contact information: [address] [place] [address] [phone]        Carlena Hurl, PA-C Follow up in 3 week(s).   Specialty:  General Surgery Why:  our office will call you Contact information: [place] [address] [phone]           Signed: Saverio Danker, [facility] 08/22/2017, 8:41 AM Pager: [phone]

## 2017-08-22 NOTE — Consult Note (Signed)
            [facility] CM Primary Care Navigator  08/22/2017  Rebekah Merritt [birth year] [id number]   Went to see patient in the room to identify possible discharge needs but she was alreadydischargehome per staff report.   PerMD note,patient was admitted for acute perforated appendicitis, and underwent laparoscopic appendectomy.  Primary care provider's office is listed as providing transition of care (TOC) follow-up.  Patient has discharge instruction to follow-up with general surgery in 3 weeks and office will call patient for RN visit only to do drain removal.   For additional questions please contact:  Edwena Felty A. Myran Arcia, BSN, RN-BC [facility] PRIMARY CARE Navigator Cell: [phone]

## 2017-08-22 NOTE — Progress Notes (Signed)
Discharge instruction reviewed with patient. Pt discharged

## 2017-09-24 DIAGNOSIS — M9902 Segmental and somatic dysfunction of thoracic region: Secondary | ICD-10-CM | POA: Diagnosis not present

## 2017-09-24 DIAGNOSIS — M47814 Spondylosis without myelopathy or radiculopathy, thoracic region: Secondary | ICD-10-CM | POA: Diagnosis not present

## 2017-09-24 DIAGNOSIS — M5022 Other cervical disc displacement, mid-cervical region, unspecified level: Secondary | ICD-10-CM | POA: Diagnosis not present

## 2017-09-24 DIAGNOSIS — M9901 Segmental and somatic dysfunction of cervical region: Secondary | ICD-10-CM | POA: Diagnosis not present

## 2017-10-08 ENCOUNTER — Inpatient Hospital Stay (HOSPITAL_BASED_OUTPATIENT_CLINIC_OR_DEPARTMENT_OTHER): Payer: PPO | Admitting: Hematology & Oncology

## 2017-10-08 ENCOUNTER — Other Ambulatory Visit: Payer: Self-pay

## 2017-10-08 ENCOUNTER — Inpatient Hospital Stay: Payer: PPO | Attending: Hematology & Oncology

## 2017-10-08 VITALS — BP 120/62 | HR 74 | Temp 98.2°F | Resp 16 | Wt 121.2 lb

## 2017-10-08 DIAGNOSIS — Z9221 Personal history of antineoplastic chemotherapy: Secondary | ICD-10-CM | POA: Diagnosis not present

## 2017-10-08 DIAGNOSIS — Z79899 Other long term (current) drug therapy: Secondary | ICD-10-CM | POA: Insufficient documentation

## 2017-10-08 DIAGNOSIS — Z853 Personal history of malignant neoplasm of breast: Secondary | ICD-10-CM | POA: Diagnosis not present

## 2017-10-08 DIAGNOSIS — K3589 Other acute appendicitis without perforation or gangrene: Secondary | ICD-10-CM

## 2017-10-08 DIAGNOSIS — Z171 Estrogen receptor negative status [ER-]: Secondary | ICD-10-CM | POA: Insufficient documentation

## 2017-10-08 DIAGNOSIS — Z9011 Acquired absence of right breast and nipple: Secondary | ICD-10-CM | POA: Insufficient documentation

## 2017-10-08 DIAGNOSIS — C50011 Malignant neoplasm of nipple and areola, right female breast: Secondary | ICD-10-CM

## 2017-10-08 LAB — CBC WITH DIFFERENTIAL (CANCER CENTER ONLY)
Basophils Absolute: 0 10*3/uL (ref 0.0–0.1)
Basophils Relative: 1 %
Eosinophils Absolute: 0.1 10*3/uL (ref 0.0–0.5)
Eosinophils Relative: 3 %
HEMATOCRIT: 40.1 % (ref 34.8–46.6)
HEMOGLOBIN: 13.2 g/dL (ref 11.6–15.9)
Lymphocytes Relative: 24 %
Lymphs Abs: 1 10*3/uL (ref 0.9–3.3)
MCH: 31.4 pg (ref 26.0–34.0)
MCHC: 32.9 g/dL (ref 32.0–36.0)
MCV: 95.2 fL (ref 81.0–101.0)
Monocytes Absolute: 0.5 10*3/uL (ref 0.1–0.9)
Monocytes Relative: 11 %
NEUTROS PCT: 61 %
Neutro Abs: 2.6 10*3/uL (ref 1.5–6.5)
PLATELETS: 308 10*3/uL (ref 145–400)
RBC: 4.21 MIL/uL (ref 3.70–5.32)
RDW: 12 % (ref 11.1–15.7)
WBC Count: 4.2 10*3/uL (ref 3.9–10.0)

## 2017-10-08 LAB — CMP (CANCER CENTER ONLY)
ALT: 28 U/L (ref 0–44)
AST: 25 U/L (ref 15–41)
Albumin: 3.9 g/dL (ref 3.5–5.0)
Alkaline Phosphatase: 64 U/L (ref 38–126)
Anion gap: 8 (ref 5–15)
BUN: 18 mg/dL (ref 8–23)
CHLORIDE: 106 mmol/L (ref 98–111)
CO2: 28 mmol/L (ref 22–32)
Calcium: 9.1 mg/dL (ref 8.9–10.3)
Creatinine: 0.84 mg/dL (ref 0.44–1.00)
Glucose, Bld: 95 mg/dL (ref 70–99)
POTASSIUM: 4.2 mmol/L (ref 3.5–5.1)
SODIUM: 142 mmol/L (ref 135–145)
Total Bilirubin: 0.5 mg/dL (ref 0.3–1.2)
Total Protein: 6.4 g/dL — ABNORMAL LOW (ref 6.5–8.1)

## 2017-10-08 NOTE — Progress Notes (Signed)
Hematology and Oncology Follow Up Visit  Rebekah Merritt [id number] [birth year] 67 y.o. 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  A lot has been going on with Rebekah and Rebekah family.  She Rebekah grandson has epilepsy.  He was diagnosed back in May.  This happened all of a sudden.  He sees a specialist at [facility] now.  He still has some small apical epileptic seizures.  She says that he is having a 24-hour EEG today.  She had acute appendicitis in early June.  She was in the hospital for 4 days.  Apparently, the appendix ruptured.  I am so thankful that she did not get septic from this.  Otherwise, breast cancer has not been a problem.  She is still very active.  She is still helping Rebekah family.  Thankfully, Rebekah Merritt is doing okay.  He has had some heart issues but they appear to be relatively quiet right now.  Overall, Rebekah performance status is ECOG 0.   Medications:  Allergies as of 10/08/2017   No Known Allergies     Medication List        Accurate as of 10/08/17  9:50 AM. Always use your most recent med list.          CALCIUM 1200 PO Take 1 tablet by mouth daily.   MULTI-VITAMIN DAILY Tabs Take 1 tablet by mouth daily.   venlafaxine 25 MG tablet Commonly known as:  EFFEXOR Take 25 mg by mouth daily.   Vitamin B-12 6000 MCG Subl Place 1 tablet under the tongue daily.       Allergies: No Known Allergies  Past Medical History, Surgical history, Social history, and Family History were reviewed and updated.  Review of Systems: All other 10 point review of systems is negative.   Physical Exam:  weight is 121 lb 4 oz (55 kg). Rebekah oral temperature is 98.2 F (36.8 C). Rebekah blood pressure is 120/62 and Rebekah pulse is 74. Rebekah respiration is 16 and oxygen saturation is 99%.   Wt Readings from Last 3  Encounters:  10/08/17 121 lb 4 oz (55 kg)  08/19/17 125 lb 1.6 oz (56.7 kg)  10/08/16 131 lb (59.4 kg)    Physical Exam  Constitutional: She is oriented to person, place, and time.  .  She has well-healed mastectomy on the right side.  There is no erythema or nodularity.  There is no right axillary adenopathy.  HENT:  Head: Normocephalic and atraumatic.  Mouth/Throat: Oropharynx is clear and moist.  Eyes: Pupils are equal, round, and reactive to light. EOM are normal.  Neck: Normal range of motion.  Cardiovascular: Normal rate, regular rhythm and normal heart sounds.  Pulmonary/Chest: Effort normal and breath sounds normal.  Abdominal: Soft. Bowel sounds are normal.  She has well-healed laparoscopy scars.  There is no fluid wave.  There is no guarding or rebound tenderness.  Musculoskeletal: Normal range of motion. She exhibits no edema, tenderness or deformity.  Lymphadenopathy:    She has no cervical adenopathy.  Neurological: She is alert and oriented to person, place, and time.  Skin: Skin is warm and dry. No rash noted. No erythema.  Psychiatric: She has a normal mood and affect. Rebekah behavior is normal. Judgment and thought content normal.  Vitals reviewed.  Breast exam shows left breast  with no masses, edema or erythema.  There is no left axillary adenopathy.  Right chest wall is well-healed  Lab Results  Component Value Date   WBC 4.2 10/08/2017   HGB 13.2 10/08/2017   HCT 40.1 10/08/2017   MCV 95.2 10/08/2017   PLT 308 10/08/2017   No results found for: FERRITIN, IRON, TIBC, UIBC, IRONPCTSAT Lab Results  Component Value Date   RBC 4.21 10/08/2017   No results found for: KPAFRELGTCHN, LAMBDASER, KAPLAMBRATIO No results found for: IGGSERUM, IGA, IGMSERUM No results found for: Odetta Pink, SPEI   Chemistry      Component Value Date/Time   NA 142 08/18/2017 2146   NA 141 10/08/2016 0956   NA 141 10/12/2015  0858   K 3.5 08/18/2017 2146   K 4.6 10/08/2016 0956   K 4.8 10/12/2015 0858   CL 106 08/18/2017 2146   CL 102 10/08/2016 0956   CO2 27 08/18/2017 2146   CO2 30 10/08/2016 0956   CO2 27 10/12/2015 0858   BUN 11 08/18/2017 2146   BUN 18 10/08/2016 0956   BUN 21.6 10/12/2015 0858   CREATININE 0.82 08/18/2017 2146   CREATININE 0.9 10/08/2016 0956   CREATININE 0.8 10/12/2015 0858      Component Value Date/Time   CALCIUM 9.2 08/18/2017 2146   CALCIUM 9.1 10/08/2016 0956   CALCIUM 8.9 10/12/2015 0858   ALKPHOS 47 08/18/2017 2146   ALKPHOS 56 10/08/2016 0956   ALKPHOS 68 10/12/2015 0858   AST 22 08/18/2017 2146   AST 42 (H) 10/08/2016 0956   AST 28 10/12/2015 0858   ALT 23 08/18/2017 2146   ALT 39 10/08/2016 0956   ALT 32 10/12/2015 0858   BILITOT 0.8 08/18/2017 2146   BILITOT 0.80 10/08/2016 0956   BILITOT 0.41 10/12/2015 0858      Impression and Plan: Rebekah Merritt is a very pleasant 67 yo caucasian female with history of stage IIA ductal carcinoma the right breast, ER negative and or positive lymph node. She completed treatment with adjuvant chemotherapy over 20 years ago.   I am absolutely shocked about the grandson with the epilepsy.  I know this is a quite troublesome disease that can cause issues.  However, it sounds like there is a very good support group for Rebekah grandson and his family.  I will also shocked by the appendicitis.  She looks fantastic for having appendicitis about 2 months ago.  Again, there is absolutely  no issues with cancer.  We will get Rebekah back in 1 year.    Volanda Napoleon, MD 7/24/20199:50 AM

## 2017-10-09 LAB — VITAMIN D 25 HYDROXY (VIT D DEFICIENCY, FRACTURES): VIT D 25 HYDROXY: 39.8 ng/mL (ref 30.0–100.0)

## 2017-10-17 DIAGNOSIS — D2372 Other benign neoplasm of skin of left lower limb, including hip: Secondary | ICD-10-CM | POA: Diagnosis not present

## 2017-10-17 DIAGNOSIS — L570 Actinic keratosis: Secondary | ICD-10-CM | POA: Diagnosis not present

## 2017-10-17 DIAGNOSIS — D2230 Melanocytic nevi of unspecified part of face: Secondary | ICD-10-CM | POA: Diagnosis not present

## 2017-10-17 DIAGNOSIS — Z86018 Personal history of other benign neoplasm: Secondary | ICD-10-CM | POA: Diagnosis not present

## 2017-10-17 DIAGNOSIS — D2272 Melanocytic nevi of left lower limb, including hip: Secondary | ICD-10-CM | POA: Diagnosis not present

## 2017-10-17 DIAGNOSIS — L821 Other seborrheic keratosis: Secondary | ICD-10-CM | POA: Diagnosis not present

## 2017-10-17 DIAGNOSIS — Z85828 Personal history of other malignant neoplasm of skin: Secondary | ICD-10-CM | POA: Diagnosis not present

## 2017-10-17 DIAGNOSIS — D225 Melanocytic nevi of trunk: Secondary | ICD-10-CM | POA: Diagnosis not present

## 2017-12-15 DIAGNOSIS — Z853 Personal history of malignant neoplasm of breast: Secondary | ICD-10-CM | POA: Diagnosis not present

## 2017-12-15 DIAGNOSIS — Z1231 Encounter for screening mammogram for malignant neoplasm of breast: Secondary | ICD-10-CM | POA: Diagnosis not present

## 2018-01-14 DIAGNOSIS — Z0000 Encounter for general adult medical examination without abnormal findings: Secondary | ICD-10-CM | POA: Diagnosis not present

## 2018-07-20 ENCOUNTER — Encounter: Payer: Self-pay | Admitting: Hematology & Oncology

## 2018-10-06 ENCOUNTER — Inpatient Hospital Stay: Payer: PPO | Attending: Hematology & Oncology | Admitting: Hematology & Oncology

## 2018-10-06 ENCOUNTER — Other Ambulatory Visit: Payer: Self-pay

## 2018-10-06 ENCOUNTER — Encounter: Payer: Self-pay | Admitting: Hematology & Oncology

## 2018-10-06 ENCOUNTER — Inpatient Hospital Stay: Payer: PPO

## 2018-10-06 DIAGNOSIS — K3589 Other acute appendicitis without perforation or gangrene: Secondary | ICD-10-CM

## 2018-10-06 DIAGNOSIS — Z79899 Other long term (current) drug therapy: Secondary | ICD-10-CM | POA: Insufficient documentation

## 2018-10-06 DIAGNOSIS — Z9011 Acquired absence of right breast and nipple: Secondary | ICD-10-CM | POA: Diagnosis not present

## 2018-10-06 DIAGNOSIS — Z853 Personal history of malignant neoplasm of breast: Secondary | ICD-10-CM

## 2018-10-06 DIAGNOSIS — Z171 Estrogen receptor negative status [ER-]: Secondary | ICD-10-CM

## 2018-10-06 DIAGNOSIS — Z9221 Personal history of antineoplastic chemotherapy: Secondary | ICD-10-CM | POA: Insufficient documentation

## 2018-10-06 LAB — CBC WITH DIFFERENTIAL (CANCER CENTER ONLY)
Abs Immature Granulocytes: 0.01 10*3/uL (ref 0.00–0.07)
Basophils Absolute: 0 10*3/uL (ref 0.0–0.1)
Basophils Relative: 1 %
Eosinophils Absolute: 0.2 10*3/uL (ref 0.0–0.5)
Eosinophils Relative: 5 %
HCT: 39.9 % (ref 36.0–46.0)
Hemoglobin: 13.2 g/dL (ref 12.0–15.0)
Immature Granulocytes: 0 %
Lymphocytes Relative: 31 %
Lymphs Abs: 1.1 10*3/uL (ref 0.7–4.0)
MCH: 31.4 pg (ref 26.0–34.0)
MCHC: 33.1 g/dL (ref 30.0–36.0)
MCV: 95 fL (ref 80.0–100.0)
Monocytes Absolute: 0.4 10*3/uL (ref 0.1–1.0)
Monocytes Relative: 12 %
Neutro Abs: 1.8 10*3/uL (ref 1.7–7.7)
Neutrophils Relative %: 51 %
Platelet Count: 270 10*3/uL (ref 150–400)
RBC: 4.2 MIL/uL (ref 3.87–5.11)
RDW: 11.7 % (ref 11.5–15.5)
WBC Count: 3.5 10*3/uL — ABNORMAL LOW (ref 4.0–10.5)
nRBC: 0 % (ref 0.0–0.2)

## 2018-10-06 LAB — CMP (CANCER CENTER ONLY)
ALT: 17 U/L (ref 0–44)
AST: 19 U/L (ref 15–41)
Albumin: 4.4 g/dL (ref 3.5–5.0)
Alkaline Phosphatase: 48 U/L (ref 38–126)
Anion gap: 7 (ref 5–15)
BUN: 21 mg/dL (ref 8–23)
CO2: 30 mmol/L (ref 22–32)
Calcium: 8.6 mg/dL — ABNORMAL LOW (ref 8.9–10.3)
Chloride: 104 mmol/L (ref 98–111)
Creatinine: 0.75 mg/dL (ref 0.44–1.00)
GFR, Est AFR Am: >60 mL/min (ref >60)
GFR, Estimated: >60 mL/min (ref >60)
Glucose, Bld: 94 mg/dL (ref 70–99)
Potassium: 4.2 mmol/L (ref 3.5–5.1)
Sodium: 141 mmol/L (ref 135–145)
Total Bilirubin: 0.6 mg/dL (ref 0.3–1.2)
Total Protein: 6.3 g/dL — ABNORMAL LOW (ref 6.5–8.1)

## 2018-10-06 NOTE — Progress Notes (Signed)
Hematology and Oncology Follow Up Visit  Rebekah Merritt [id number] 17-May-1950 68 y.o. 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  So far, everything is been going pretty well.  Thankfully, her grandson has not had any episodes of seizure for a year.  He can now drive again.  Her family has been getting together.  They live in different parts of the [place].  She make sure that everybody is safe.  There is been no issues with respect to nausea vomiting.  She had the appendicitis back in June 2019.  She is gotten over this without any difficulty.  There is been no problems with nausea or vomiting.  She is been no change in bowel or bladder habits.  She has had no cough.  She has had no fever.  There has been no bleeding.  Overall, her performance status is ECOG 1.     Medications:  Allergies as of 10/06/2018   No Known Allergies     Medication List       Accurate as of October 06, 2018  9:49 AM. If you have any questions, ask your nurse or doctor.        CALCIUM 1200 PO Take 1 tablet by mouth daily.   Multi-Vitamin Daily Tabs Take 1 tablet by mouth daily.   venlafaxine 25 MG tablet Commonly known as: EFFEXOR Take 25 mg by mouth daily.   Vitamin B-12 6000 MCG Subl Place 1 tablet under the tongue daily.       Allergies: No Known Allergies  Past Medical History, Surgical history, Social history, and Family History were reviewed and updated.  Review of Systems: All other 10 point review of systems is negative.   Physical Exam:  vitals were not taken for this visit.   Wt Readings from Last 3 Encounters:  10/08/17 121 lb 4 oz (55 kg)  08/19/17 125 lb 1.6 oz (56.7 kg)  10/08/16 131 lb (59.4 kg)    Physical Exam Vitals signs reviewed.  Constitutional:      Comments: .  She has well-healed  mastectomy on the right side.  There is no erythema or nodularity.  There is no right axillary adenopathy.  HENT:     Head: Normocephalic and atraumatic.  Eyes:     Pupils: Pupils are equal, round, and reactive to light.  Neck:     Musculoskeletal: Normal range of motion.  Cardiovascular:     Rate and Rhythm: Normal rate and regular rhythm.     Heart sounds: Normal heart sounds.  Pulmonary:     Effort: Pulmonary effort is normal.     Breath sounds: Normal breath sounds.  Abdominal:     General: Bowel sounds are normal.     Palpations: Abdomen is soft.     Comments: She has well-healed laparoscopy scars.  There is no fluid wave.  There is no guarding or rebound tenderness.  Musculoskeletal: Normal range of motion.        General: No tenderness or deformity.  Lymphadenopathy:     Cervical: No cervical adenopathy.  Skin:    General: Skin is warm and dry.     Findings: No erythema or rash.  Neurological:     Mental Status: She is alert and oriented to person, place, and time.  Psychiatric:  Behavior: Behavior normal.        Thought Content: Thought content normal.        Judgment: Judgment normal.    Breast exam shows left breast with no masses, edema or erythema.  There is no left axillary adenopathy.  Right chest wall is well-healed  Lab Results  Component Value Date   WBC 3.5 (L) 10/06/2018   HGB 13.2 10/06/2018   HCT 39.9 10/06/2018   MCV 95.0 10/06/2018   PLT 270 10/06/2018   No results found for: FERRITIN, IRON, TIBC, UIBC, IRONPCTSAT Lab Results  Component Value Date   RBC 4.20 10/06/2018   No results found for: KPAFRELGTCHN, LAMBDASER, KAPLAMBRATIO No results found for: IGGSERUM, IGA, IGMSERUM No results found for: Odetta Pink, SPEI   Chemistry      Component Value Date/Time   NA 142 10/08/2017 0910   NA 141 10/08/2016 0956   NA 141 10/12/2015 0858   K 4.2 10/08/2017 0910   K 4.6 10/08/2016 0956    K 4.8 10/12/2015 0858   CL 106 10/08/2017 0910   CL 102 10/08/2016 0956   CO2 28 10/08/2017 0910   CO2 30 10/08/2016 0956   CO2 27 10/12/2015 0858   BUN 18 10/08/2017 0910   BUN 18 10/08/2016 0956   BUN 21.6 10/12/2015 0858   CREATININE 0.84 10/08/2017 0910   CREATININE 0.9 10/08/2016 0956   CREATININE 0.8 10/12/2015 0858      Component Value Date/Time   CALCIUM 9.1 10/08/2017 0910   CALCIUM 9.1 10/08/2016 0956   CALCIUM 8.9 10/12/2015 0858   ALKPHOS 64 10/08/2017 0910   ALKPHOS 56 10/08/2016 0956   ALKPHOS 68 10/12/2015 0858   AST 25 10/08/2017 0910   AST 28 10/12/2015 0858   ALT 28 10/08/2017 0910   ALT 39 10/08/2016 0956   ALT 32 10/12/2015 0858   BILITOT 0.5 10/08/2017 0910   BILITOT 0.41 10/12/2015 0858      Impression and Plan: Rebekah Merritt is a very pleasant 68 yo caucasian female with history of stage IIA ductal carcinoma the right breast, ER negative and or positive lymph node. She completed treatment with adjuvant chemotherapy over 20 years ago.   Again, there is absolutely  no issues with cancer.  I really think that she is cured.  If she still like to come back to see [place].  She just feels confident about our examinations.  We will get her back in 1 year.    Volanda Napoleon, MD 7/21/20209:49 AM

## 2018-10-13 ENCOUNTER — Telehealth: Payer: Self-pay | Admitting: *Deleted

## 2018-10-13 NOTE — Telephone Encounter (Signed)
Call received from patient requesting lab results from 10/06/18.  Lab results given to patient.  Patient has no further questions at this time.

## 2018-10-23 DIAGNOSIS — D2372 Other benign neoplasm of skin of left lower limb, including hip: Secondary | ICD-10-CM | POA: Diagnosis not present

## 2018-10-23 DIAGNOSIS — D225 Melanocytic nevi of trunk: Secondary | ICD-10-CM | POA: Diagnosis not present

## 2018-10-23 DIAGNOSIS — L570 Actinic keratosis: Secondary | ICD-10-CM | POA: Diagnosis not present

## 2018-10-23 DIAGNOSIS — L814 Other melanin hyperpigmentation: Secondary | ICD-10-CM | POA: Diagnosis not present

## 2018-10-23 DIAGNOSIS — L821 Other seborrheic keratosis: Secondary | ICD-10-CM | POA: Diagnosis not present

## 2018-10-23 DIAGNOSIS — D2272 Melanocytic nevi of left lower limb, including hip: Secondary | ICD-10-CM | POA: Diagnosis not present

## 2018-10-23 DIAGNOSIS — D485 Neoplasm of uncertain behavior of skin: Secondary | ICD-10-CM | POA: Diagnosis not present

## 2018-10-23 DIAGNOSIS — Z86018 Personal history of other benign neoplasm: Secondary | ICD-10-CM | POA: Diagnosis not present

## 2018-10-23 DIAGNOSIS — C44319 Basal cell carcinoma of skin of other parts of face: Secondary | ICD-10-CM | POA: Diagnosis not present

## 2018-10-23 DIAGNOSIS — D2230 Melanocytic nevi of unspecified part of face: Secondary | ICD-10-CM | POA: Diagnosis not present

## 2018-10-23 DIAGNOSIS — Z85828 Personal history of other malignant neoplasm of skin: Secondary | ICD-10-CM | POA: Diagnosis not present

## 2018-12-02 DIAGNOSIS — C44319 Basal cell carcinoma of skin of other parts of face: Secondary | ICD-10-CM | POA: Diagnosis not present

## 2018-12-21 DIAGNOSIS — Z1231 Encounter for screening mammogram for malignant neoplasm of breast: Secondary | ICD-10-CM | POA: Diagnosis not present

## 2018-12-21 DIAGNOSIS — Z853 Personal history of malignant neoplasm of breast: Secondary | ICD-10-CM | POA: Diagnosis not present

## 2018-12-21 DIAGNOSIS — N6489 Other specified disorders of breast: Secondary | ICD-10-CM | POA: Diagnosis not present

## 2019-01-13 ENCOUNTER — Other Ambulatory Visit (HOSPITAL_COMMUNITY): Admission: RE | Admit: 2019-01-13 | Payer: PPO | Source: Ambulatory Visit

## 2019-01-13 ENCOUNTER — Other Ambulatory Visit: Payer: Self-pay | Admitting: Family Medicine

## 2019-01-13 DIAGNOSIS — M797 Fibromyalgia: Secondary | ICD-10-CM | POA: Diagnosis not present

## 2019-01-13 DIAGNOSIS — M859 Disorder of bone density and structure, unspecified: Secondary | ICD-10-CM | POA: Diagnosis not present

## 2019-01-13 DIAGNOSIS — E782 Mixed hyperlipidemia: Secondary | ICD-10-CM | POA: Diagnosis not present

## 2019-01-13 DIAGNOSIS — Z0000 Encounter for general adult medical examination without abnormal findings: Secondary | ICD-10-CM | POA: Diagnosis not present

## 2019-01-13 DIAGNOSIS — Z853 Personal history of malignant neoplasm of breast: Secondary | ICD-10-CM | POA: Diagnosis not present

## 2019-01-13 DIAGNOSIS — Z1211 Encounter for screening for malignant neoplasm of colon: Secondary | ICD-10-CM | POA: Diagnosis not present

## 2019-01-13 DIAGNOSIS — Z1389 Encounter for screening for other disorder: Secondary | ICD-10-CM | POA: Diagnosis not present

## 2019-01-20 LAB — CYTOLOGY - PAP: Diagnosis: REACTIVE

## 2019-01-22 DIAGNOSIS — Z1211 Encounter for screening for malignant neoplasm of colon: Secondary | ICD-10-CM | POA: Diagnosis not present

## 2019-05-01 DIAGNOSIS — Z20822 Contact with and (suspected) exposure to covid-19: Secondary | ICD-10-CM | POA: Diagnosis not present

## 2019-05-01 DIAGNOSIS — Z20828 Contact with and (suspected) exposure to other viral communicable diseases: Secondary | ICD-10-CM | POA: Diagnosis not present

## 2019-05-10 DIAGNOSIS — M216X1 Other acquired deformities of right foot: Secondary | ICD-10-CM | POA: Diagnosis not present

## 2019-05-10 DIAGNOSIS — M2031 Hallux varus (acquired), right foot: Secondary | ICD-10-CM | POA: Diagnosis not present

## 2019-06-21 DIAGNOSIS — Z85828 Personal history of other malignant neoplasm of skin: Secondary | ICD-10-CM | POA: Diagnosis not present

## 2019-06-21 DIAGNOSIS — L570 Actinic keratosis: Secondary | ICD-10-CM | POA: Diagnosis not present

## 2019-06-21 DIAGNOSIS — L905 Scar conditions and fibrosis of skin: Secondary | ICD-10-CM | POA: Diagnosis not present

## 2019-06-21 DIAGNOSIS — D225 Melanocytic nevi of trunk: Secondary | ICD-10-CM | POA: Diagnosis not present

## 2019-06-21 DIAGNOSIS — L821 Other seborrheic keratosis: Secondary | ICD-10-CM | POA: Diagnosis not present

## 2019-07-05 ENCOUNTER — Other Ambulatory Visit: Payer: Self-pay

## 2019-07-05 ENCOUNTER — Other Ambulatory Visit (HOSPITAL_COMMUNITY): Payer: Self-pay | Admitting: Family Medicine

## 2019-07-05 ENCOUNTER — Ambulatory Visit (HOSPITAL_COMMUNITY)
Admission: RE | Admit: 2019-07-05 | Discharge: 2019-07-05 | Disposition: A | Discharge status: Home / Self Care | Payer: PPO | Source: Ambulatory Visit | Attending: Family Medicine | Admitting: Family Medicine

## 2019-07-05 DIAGNOSIS — M7989 Other specified soft tissue disorders: Secondary | ICD-10-CM

## 2019-07-05 DIAGNOSIS — I8390 Asymptomatic varicose veins of unspecified lower extremity: Secondary | ICD-10-CM | POA: Diagnosis not present

## 2019-07-05 DIAGNOSIS — M79605 Pain in left leg: Secondary | ICD-10-CM | POA: Insufficient documentation

## 2019-07-05 DIAGNOSIS — I809 Phlebitis and thrombophlebitis of unspecified site: Secondary | ICD-10-CM | POA: Diagnosis not present

## 2019-07-05 NOTE — Progress Notes (Signed)
Left lower extremity venous duplex completed. Refer to "CV Proc" under chart review to view preliminary results.  07/05/2019 4:31 PM Kelby Aline., MHA, RVT, RDCS, RDMS

## 2019-10-05 ENCOUNTER — Encounter: Payer: Self-pay | Admitting: *Deleted

## 2019-10-05 ENCOUNTER — Telehealth: Payer: Self-pay | Admitting: Hematology & Oncology

## 2019-10-05 ENCOUNTER — Inpatient Hospital Stay: Payer: PPO

## 2019-10-05 ENCOUNTER — Other Ambulatory Visit: Payer: Self-pay

## 2019-10-05 ENCOUNTER — Encounter: Payer: Self-pay | Admitting: Hematology & Oncology

## 2019-10-05 ENCOUNTER — Inpatient Hospital Stay: Payer: PPO | Attending: Hematology & Oncology | Admitting: Hematology & Oncology

## 2019-10-05 VITALS — BP 153/70 | HR 71 | Temp 98.8°F | Resp 16 | Wt 132.0 lb

## 2019-10-05 DIAGNOSIS — M818 Other osteoporosis without current pathological fracture: Secondary | ICD-10-CM

## 2019-10-05 DIAGNOSIS — Z853 Personal history of malignant neoplasm of breast: Secondary | ICD-10-CM | POA: Insufficient documentation

## 2019-10-05 DIAGNOSIS — Z9221 Personal history of antineoplastic chemotherapy: Secondary | ICD-10-CM | POA: Insufficient documentation

## 2019-10-05 DIAGNOSIS — K3589 Other acute appendicitis without perforation or gangrene: Secondary | ICD-10-CM

## 2019-10-05 LAB — CMP (CANCER CENTER ONLY)
ALT: 20 U/L (ref 0–44)
AST: 23 U/L (ref 15–41)
Albumin: 4.7 g/dL (ref 3.5–5.0)
Alkaline Phosphatase: 50 U/L (ref 38–126)
Anion gap: 6 (ref 5–15)
BUN: 20 mg/dL (ref 8–23)
CO2: 30 mmol/L (ref 22–32)
Calcium: 10 mg/dL (ref 8.9–10.3)
Chloride: 104 mmol/L (ref 98–111)
Creatinine: 0.8 mg/dL (ref 0.44–1.00)
GFR, Est AFR Am: >60 mL/min (ref >60)
GFR, Estimated: >60 mL/min (ref >60)
Glucose, Bld: 114 mg/dL — ABNORMAL HIGH (ref 70–99)
Potassium: 4.9 mmol/L (ref 3.5–5.1)
Sodium: 140 mmol/L (ref 135–145)
Total Bilirubin: 0.6 mg/dL (ref 0.3–1.2)
Total Protein: 6.9 g/dL (ref 6.5–8.1)

## 2019-10-05 LAB — CBC WITH DIFFERENTIAL (CANCER CENTER ONLY)
Abs Immature Granulocytes: 0.02 10*3/uL (ref 0.00–0.07)
Basophils Absolute: 0.1 10*3/uL (ref 0.0–0.1)
Basophils Relative: 1 %
Eosinophils Absolute: 0.2 10*3/uL (ref 0.0–0.5)
Eosinophils Relative: 5 %
HCT: 40.9 % (ref 36.0–46.0)
Hemoglobin: 13.5 g/dL (ref 12.0–15.0)
Immature Granulocytes: 1 %
Lymphocytes Relative: 31 %
Lymphs Abs: 1.2 10*3/uL (ref 0.7–4.0)
MCH: 31.4 pg (ref 26.0–34.0)
MCHC: 33 g/dL (ref 30.0–36.0)
MCV: 95.1 fL (ref 80.0–100.0)
Monocytes Absolute: 0.4 10*3/uL (ref 0.1–1.0)
Monocytes Relative: 12 %
Neutro Abs: 1.9 10*3/uL (ref 1.7–7.7)
Neutrophils Relative %: 50 %
Platelet Count: 261 10*3/uL (ref 150–400)
RBC: 4.3 MIL/uL (ref 3.87–5.11)
RDW: 11.8 % (ref 11.5–15.5)
WBC Count: 3.7 10*3/uL — ABNORMAL LOW (ref 4.0–10.5)
nRBC: 0 % (ref 0.0–0.2)

## 2019-10-05 LAB — VITAMIN D 25 HYDROXY (VIT D DEFICIENCY, FRACTURES): Vit D, 25-Hydroxy: 51.56 ng/mL (ref 30–100)

## 2019-10-05 NOTE — Telephone Encounter (Signed)
Appointments scheduled calendar printed per 7/20 los 

## 2019-10-05 NOTE — Progress Notes (Signed)
Hematology and Oncology Follow Up Visit  Rebekah Merritt [id number] 06-13-1950 69 y.o. 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  She really looks great.  She will be 69 years old in December.  Her 50th wedding anniversary will be in October.  This will be a big year for her.  All of her grandchildren are doing well.  Her grandson, who has epilepsy has not had a seizure for 2 years.  He will graduate high school.  She has had no problems with her her own health.  She does use a 5-FU cream for her skin.  She has some solar keratosis.  She has had no change in bowel or bladder habits.  She has had no cough or shortness of breath.  She is not had any problems with the coronavirus.  She has had the vaccine.  There is been no problems with fever.  She has had no rashes.  She has had no leg swelling.  There is been no change in medications.  Overall, her performance status is ECOG 0.     Medications:  Allergies as of 10/05/2019   No Known Allergies     Medication List       Accurate as of October 05, 2019 10:26 AM. If you have any questions, ask your nurse or doctor.        CALCIUM 1200 PO Take 1 tablet by mouth daily.   Multi-Vitamin Daily Tabs Take 1 tablet by mouth daily.   venlafaxine 25 MG tablet Commonly known as: EFFEXOR Take 25 mg by mouth daily.   Vitamin B-12 6000 MCG Subl Place 1 tablet under the tongue daily.       Allergies: No Known Allergies  Past Medical History, Surgical history, Social history, and Family History were reviewed and updated.  Review of Systems: All other 10 point review of systems is negative.   Physical Exam:  weight is 132 lb (59.9 kg). Her oral temperature is 98.8 F (37.1 C). Her blood pressure is 153/70 (abnormal) and her pulse is 71. Her respiration is 16  and oxygen saturation is 98%.   Wt Readings from Last 3 Encounters:  10/05/19 132 lb (59.9 kg)  10/08/17 121 lb 4 oz (55 kg)  08/19/17 125 lb 1.6 oz (56.7 kg)    Physical Exam Vitals reviewed.  Constitutional:      Comments: .  She has well-healed mastectomy on the right side.  There is no erythema or nodularity.  There is no right axillary adenopathy.  HENT:     Head: Normocephalic and atraumatic.  Eyes:     Pupils: Pupils are equal, round, and reactive to light.  Cardiovascular:     Rate and Rhythm: Normal rate and regular rhythm.     Heart sounds: Normal heart sounds.  Pulmonary:     Effort: Pulmonary effort is normal.     Breath sounds: Normal breath sounds.  Abdominal:     General: Bowel sounds are normal.     Palpations: Abdomen is soft.     Comments: She has well-healed laparoscopy scars.  There is no fluid wave.  There is no guarding or rebound tenderness.  Musculoskeletal:        General: No tenderness or deformity. Normal range of motion.     Cervical back: Normal range of  motion.  Lymphadenopathy:     Cervical: No cervical adenopathy.  Skin:    General: Skin is warm and dry.     Findings: No erythema or rash.  Neurological:     Mental Status: She is alert and oriented to person, place, and time.  Psychiatric:        Behavior: Behavior normal.        Thought Content: Thought content normal.        Judgment: Judgment normal.    Breast exam shows left breast with no masses, edema or erythema.  There is no left axillary adenopathy.  Right chest wall is well-healed  Lab Results  Component Value Date   WBC 3.7 (L) 10/05/2019   HGB 13.5 10/05/2019   HCT 40.9 10/05/2019   MCV 95.1 10/05/2019   PLT 261 10/05/2019   No results found for: FERRITIN, IRON, TIBC, UIBC, IRONPCTSAT Lab Results  Component Value Date   RBC 4.30 10/05/2019   No results found for: KPAFRELGTCHN, LAMBDASER, KAPLAMBRATIO No results found for: IGGSERUM, IGA, IGMSERUM No results found  for: Odetta Pink, SPEI   Chemistry      Component Value Date/Time   NA 140 10/05/2019 0858   NA 141 10/08/2016 0956   NA 141 10/12/2015 0858   K 4.9 10/05/2019 0858   K 4.6 10/08/2016 0956   K 4.8 10/12/2015 0858   CL 104 10/05/2019 0858   CL 102 10/08/2016 0956   CO2 30 10/05/2019 0858   CO2 30 10/08/2016 0956   CO2 27 10/12/2015 0858   BUN 20 10/05/2019 0858   BUN 18 10/08/2016 0956   BUN 21.6 10/12/2015 0858   CREATININE 0.80 10/05/2019 0858   CREATININE 0.9 10/08/2016 0956   CREATININE 0.8 10/12/2015 0858      Component Value Date/Time   CALCIUM 10.0 10/05/2019 0858   CALCIUM 9.1 10/08/2016 0956   CALCIUM 8.9 10/12/2015 0858   ALKPHOS 50 10/05/2019 0858   ALKPHOS 56 10/08/2016 0956   ALKPHOS 68 10/12/2015 0858   AST 23 10/05/2019 0858   AST 28 10/12/2015 0858   ALT 20 10/05/2019 0858   ALT 39 10/08/2016 0956   ALT 32 10/12/2015 0858   BILITOT 0.6 10/05/2019 0858   BILITOT 0.41 10/12/2015 0858      Impression and Plan: Rebekah Merritt is a very pleasant 69 yo caucasian female with history of stage IIA ductal carcinoma the right breast, ER negative and one positive lymph node. She completed treatment with adjuvant chemotherapy over 20 years ago.   Again, there is absolutely  no issues with cancer.  I really think that she is cured.  She still like to come back to see [place].  She just feels confident about our examinations.  We will get her back in 1 year.    Volanda Napoleon, MD 7/20/202110:26 AM

## 2019-11-29 DIAGNOSIS — M542 Cervicalgia: Secondary | ICD-10-CM | POA: Diagnosis not present

## 2019-12-06 DIAGNOSIS — M542 Cervicalgia: Secondary | ICD-10-CM | POA: Diagnosis not present

## 2019-12-10 DIAGNOSIS — M542 Cervicalgia: Secondary | ICD-10-CM | POA: Diagnosis not present

## 2019-12-17 DIAGNOSIS — M542 Cervicalgia: Secondary | ICD-10-CM | POA: Diagnosis not present

## 2019-12-20 DIAGNOSIS — M542 Cervicalgia: Secondary | ICD-10-CM | POA: Diagnosis not present

## 2019-12-21 DIAGNOSIS — L821 Other seborrheic keratosis: Secondary | ICD-10-CM | POA: Diagnosis not present

## 2019-12-21 DIAGNOSIS — D485 Neoplasm of uncertain behavior of skin: Secondary | ICD-10-CM | POA: Diagnosis not present

## 2019-12-21 DIAGNOSIS — L820 Inflamed seborrheic keratosis: Secondary | ICD-10-CM | POA: Diagnosis not present

## 2019-12-21 DIAGNOSIS — L300 Nummular dermatitis: Secondary | ICD-10-CM | POA: Diagnosis not present

## 2019-12-21 DIAGNOSIS — D235 Other benign neoplasm of skin of trunk: Secondary | ICD-10-CM | POA: Diagnosis not present

## 2019-12-21 DIAGNOSIS — L814 Other melanin hyperpigmentation: Secondary | ICD-10-CM | POA: Diagnosis not present

## 2019-12-21 DIAGNOSIS — D225 Melanocytic nevi of trunk: Secondary | ICD-10-CM | POA: Diagnosis not present

## 2019-12-21 DIAGNOSIS — Z85828 Personal history of other malignant neoplasm of skin: Secondary | ICD-10-CM | POA: Diagnosis not present

## 2019-12-21 DIAGNOSIS — L905 Scar conditions and fibrosis of skin: Secondary | ICD-10-CM | POA: Diagnosis not present

## 2019-12-21 DIAGNOSIS — D1801 Hemangioma of skin and subcutaneous tissue: Secondary | ICD-10-CM | POA: Diagnosis not present

## 2019-12-24 DIAGNOSIS — M542 Cervicalgia: Secondary | ICD-10-CM | POA: Diagnosis not present

## 2019-12-27 DIAGNOSIS — Z1231 Encounter for screening mammogram for malignant neoplasm of breast: Secondary | ICD-10-CM | POA: Diagnosis not present

## 2019-12-31 DIAGNOSIS — M542 Cervicalgia: Secondary | ICD-10-CM | POA: Diagnosis not present

## 2020-01-07 DIAGNOSIS — M542 Cervicalgia: Secondary | ICD-10-CM | POA: Diagnosis not present

## 2020-01-14 DIAGNOSIS — M542 Cervicalgia: Secondary | ICD-10-CM | POA: Diagnosis not present

## 2020-01-19 DIAGNOSIS — Z1389 Encounter for screening for other disorder: Secondary | ICD-10-CM | POA: Diagnosis not present

## 2020-01-19 DIAGNOSIS — Z0000 Encounter for general adult medical examination without abnormal findings: Secondary | ICD-10-CM | POA: Diagnosis not present

## 2020-01-19 DIAGNOSIS — E559 Vitamin D deficiency, unspecified: Secondary | ICD-10-CM | POA: Diagnosis not present

## 2020-01-19 DIAGNOSIS — M8580 Other specified disorders of bone density and structure, unspecified site: Secondary | ICD-10-CM | POA: Diagnosis not present

## 2020-01-19 DIAGNOSIS — E782 Mixed hyperlipidemia: Secondary | ICD-10-CM | POA: Diagnosis not present

## 2020-01-19 DIAGNOSIS — Z853 Personal history of malignant neoplasm of breast: Secondary | ICD-10-CM | POA: Diagnosis not present

## 2020-01-19 DIAGNOSIS — M797 Fibromyalgia: Secondary | ICD-10-CM | POA: Diagnosis not present

## 2020-01-21 DIAGNOSIS — M542 Cervicalgia: Secondary | ICD-10-CM | POA: Diagnosis not present

## 2020-01-31 DIAGNOSIS — M8589 Other specified disorders of bone density and structure, multiple sites: Secondary | ICD-10-CM | POA: Diagnosis not present

## 2020-01-31 DIAGNOSIS — M542 Cervicalgia: Secondary | ICD-10-CM | POA: Diagnosis not present

## 2020-02-18 DIAGNOSIS — M542 Cervicalgia: Secondary | ICD-10-CM | POA: Diagnosis not present

## 2020-03-03 DIAGNOSIS — M542 Cervicalgia: Secondary | ICD-10-CM | POA: Diagnosis not present

## 2020-03-31 DIAGNOSIS — M542 Cervicalgia: Secondary | ICD-10-CM | POA: Diagnosis not present

## 2020-04-25 DIAGNOSIS — Z01812 Encounter for preprocedural laboratory examination: Secondary | ICD-10-CM | POA: Diagnosis not present

## 2020-04-29 DIAGNOSIS — Z03818 Encounter for observation for suspected exposure to other biological agents ruled out: Secondary | ICD-10-CM | POA: Diagnosis not present

## 2020-04-29 DIAGNOSIS — Z20822 Contact with and (suspected) exposure to covid-19: Secondary | ICD-10-CM | POA: Diagnosis not present

## 2020-05-12 DIAGNOSIS — M542 Cervicalgia: Secondary | ICD-10-CM | POA: Diagnosis not present

## 2020-06-09 DIAGNOSIS — M542 Cervicalgia: Secondary | ICD-10-CM | POA: Diagnosis not present

## 2020-07-14 DIAGNOSIS — M542 Cervicalgia: Secondary | ICD-10-CM | POA: Diagnosis not present

## 2020-07-31 DIAGNOSIS — R251 Tremor, unspecified: Secondary | ICD-10-CM | POA: Diagnosis not present

## 2020-07-31 DIAGNOSIS — R55 Syncope and collapse: Secondary | ICD-10-CM | POA: Diagnosis not present

## 2020-07-31 DIAGNOSIS — R42 Dizziness and giddiness: Secondary | ICD-10-CM | POA: Diagnosis not present

## 2020-08-03 ENCOUNTER — Other Ambulatory Visit: Payer: Self-pay

## 2020-08-03 ENCOUNTER — Telehealth: Payer: Self-pay | Admitting: *Deleted

## 2020-08-03 ENCOUNTER — Emergency Department (HOSPITAL_COMMUNITY): Payer: Medicare HMO

## 2020-08-03 ENCOUNTER — Encounter (HOSPITAL_COMMUNITY): Payer: Self-pay

## 2020-08-03 ENCOUNTER — Emergency Department (HOSPITAL_BASED_OUTPATIENT_CLINIC_OR_DEPARTMENT_OTHER): Payer: Medicare HMO

## 2020-08-03 ENCOUNTER — Encounter: Payer: Self-pay | Admitting: *Deleted

## 2020-08-03 ENCOUNTER — Emergency Department (HOSPITAL_COMMUNITY)
Admission: EM | Admit: 2020-08-03 | Discharge: 2020-08-03 | Disposition: A | Discharge status: Home / Self Care | Payer: Medicare HMO | Attending: Emergency Medicine | Admitting: Emergency Medicine

## 2020-08-03 DIAGNOSIS — Z853 Personal history of malignant neoplasm of breast: Secondary | ICD-10-CM | POA: Diagnosis not present

## 2020-08-03 DIAGNOSIS — Z87891 Personal history of nicotine dependence: Secondary | ICD-10-CM | POA: Diagnosis not present

## 2020-08-03 DIAGNOSIS — R569 Unspecified convulsions: Secondary | ICD-10-CM | POA: Insufficient documentation

## 2020-08-03 DIAGNOSIS — Z8542 Personal history of malignant neoplasm of other parts of uterus: Secondary | ICD-10-CM | POA: Diagnosis not present

## 2020-08-03 DIAGNOSIS — G319 Degenerative disease of nervous system, unspecified: Secondary | ICD-10-CM | POA: Diagnosis not present

## 2020-08-03 DIAGNOSIS — R55 Syncope and collapse: Secondary | ICD-10-CM | POA: Diagnosis not present

## 2020-08-03 DIAGNOSIS — Z85828 Personal history of other malignant neoplasm of skin: Secondary | ICD-10-CM | POA: Insufficient documentation

## 2020-08-03 LAB — DIFFERENTIAL
Abs Immature Granulocytes: 0.01 10*3/uL (ref 0.00–0.07)
Basophils Absolute: 0 10*3/uL (ref 0.0–0.1)
Basophils Relative: 1 %
Eosinophils Absolute: 0.1 10*3/uL (ref 0.0–0.5)
Eosinophils Relative: 3 %
Immature Granulocytes: 0 %
Lymphocytes Relative: 26 %
Lymphs Abs: 1 10*3/uL (ref 0.7–4.0)
Monocytes Absolute: 0.5 10*3/uL (ref 0.1–1.0)
Monocytes Relative: 12 %
Neutro Abs: 2.2 10*3/uL (ref 1.7–7.7)
Neutrophils Relative %: 58 %

## 2020-08-03 LAB — PROTIME-INR
INR: 1 (ref 0.8–1.2)
Prothrombin Time: 12.7 seconds (ref 11.4–15.2)

## 2020-08-03 LAB — CBC
HCT: 39.7 % (ref 36.0–46.0)
Hemoglobin: 13.6 g/dL (ref 12.0–15.0)
MCH: 32.2 pg (ref 26.0–34.0)
MCHC: 34.3 g/dL (ref 30.0–36.0)
MCV: 93.9 fL (ref 80.0–100.0)
Platelets: 142 10*3/uL — ABNORMAL LOW (ref 150–400)
RBC: 4.23 MIL/uL (ref 3.87–5.11)
RDW: 11.6 % (ref 11.5–15.5)
WBC: 3.8 10*3/uL — ABNORMAL LOW (ref 4.0–10.5)
nRBC: 0 % (ref 0.0–0.2)

## 2020-08-03 LAB — COMPREHENSIVE METABOLIC PANEL
ALT: 21 U/L (ref 0–44)
AST: 21 U/L (ref 15–41)
Albumin: 3.7 g/dL (ref 3.5–5.0)
Alkaline Phosphatase: 40 U/L (ref 38–126)
Anion gap: 6 (ref 5–15)
BUN: 15 mg/dL (ref 8–23)
CO2: 26 mmol/L (ref 22–32)
Calcium: 8.8 mg/dL — ABNORMAL LOW (ref 8.9–10.3)
Chloride: 106 mmol/L (ref 98–111)
Creatinine, Ser: 0.89 mg/dL (ref 0.44–1.00)
GFR, Estimated: >60 mL/min (ref >60)
Glucose, Bld: 102 mg/dL — ABNORMAL HIGH (ref 70–99)
Potassium: 3.9 mmol/L (ref 3.5–5.1)
Sodium: 138 mmol/L (ref 135–145)
Total Bilirubin: 0.9 mg/dL (ref 0.3–1.2)
Total Protein: 6 g/dL — ABNORMAL LOW (ref 6.5–8.1)

## 2020-08-03 LAB — APTT: aPTT: 26 seconds (ref 24–36)

## 2020-08-03 LAB — ETHANOL: Alcohol, Ethyl (B): <10 mg/dL (ref <10)

## 2020-08-03 MED ORDER — LORAZEPAM 2 MG/ML IJ SOLN: 0.5000 mg | Freq: Once | INTRAMUSCULAR | Status: DC | PRN

## 2020-08-03 NOTE — ED Triage Notes (Signed)
Last Sunday at church stated with dizziness.  Saw a pink light and saw stars and told husband she was going to faint and she states she fainted for 60 sec.  Wanted to wait till Monday to go to dr,. Went to dr Monday and PCP wanted her to go to neuro possible seizures.  Waited till Tuesday they were closed yesterday morning couldn't see her till august.  PCP told pt to come today to ER if wanted to get testing done earlier than august.  C/o that "the pink light comes and that warm feeling coming.  I worry about body flow and I am a breast cancer survivor so I have a lot going on in my head."

## 2020-08-03 NOTE — ED Notes (Signed)
Patient transported to MRI 

## 2020-08-03 NOTE — ED Provider Notes (Signed)
[facility] EMERGENCY DEPARTMENT Provider Note   CSN: [id number] Arrival date & time: 08/03/20  0730     History Chief Complaint  Patient presents with  . Loss of Consciousness    [facility] is a 70 y.o. female.  HPI   Patient presented to the ED for evaluation of syncopal and near syncopal spells.  Patient states she had an episode last Sunday going to church.  Her vision turned pink and she started seeing stars.  She told her husband she was going to faint and then she had an episode of fainting that lasted approximately 60 seconds.  She recovered.  Patient did have some shaking of her right arm during the episode.  She went to her primary care doctor on Monday.  Her doctor felt she could have possible seizures.  She was referred to [facility] neurology but the next available appointment is in August.  Patient called her doctor and was told to come to the ER so she can get her testing done earlier than August.  Patient continues to have episodes of a sensation of warmth as if she might pass out whenever she sits down.  That has happened a few times over the last several days but she has not had any recurrent syncopal episodes.  She is not having any headache.  She does not have any focal numbness or weakness.  She denies any vomiting or diarrhea.  Past Medical History:  Diagnosis Date  . Breast cancer, right breast ([place]) 1999  . Skin cancer    "scraped off other parts of my body" (08/19/2017)  . Squamous carcinoma    "cut off LLE" (08/19/2017)  . Uterine cancer [facility]) 2000    Patient Active Problem List   Diagnosis Date Noted  . Appendicitis 08/19/2017    Past Surgical History:  Procedure Laterality Date  . ABDOMINAL HYSTERECTOMY  2000  . APPENDECTOMY  08/19/2017  . BREAST BIOPSY Right 1999  . DILATION AND CURETTAGE OF UTERUS    . LAPAROSCOPIC APPENDECTOMY N/A 08/19/2017   Procedure: APPENDECTOMY LAPAROSCOPIC;  Surgeon: Kinsinger, Arta Bruce, MD;   Location: [facility];  Service: General;  Laterality: N/A;  . MASTECTOMY Right 1999  . SQUAMOUS CELL CARCINOMA EXCISION     LLE  . TUBAL LIGATION       OB History   No obstetric history on file.     No family history on file.  Social History   Tobacco Use  . Smoking status: Former Smoker    Packs/day: 1.00    Years: 17.00    Pack years: 17.00    Types: Cigarettes    Quit date: 1983    Years since quitting: 39.4  . Smokeless tobacco: Never Used  Vaping Use  . Vaping Use: Never used  Substance Use Topics  . Alcohol use: Yes    Alcohol/week: 7.0 standard drinks    Types: 7 Glasses of wine per week  . Drug use: Never    Home Medications Prior to Admission medications   Medication Sig Start Date End Date Taking? Authorizing Provider  Calcium Carbonate-Vit D-Min (CALCIUM 1200 PO) Take 1 tablet by mouth daily.     [provider]  Cyanocobalamin (VITAMIN B-12) 6000 MCG SUBL Place 1 tablet under the tongue daily.     [provider]  Multiple Vitamin (MULTI-VITAMIN DAILY) TABS Take 1 tablet by mouth daily.     [provider]  venlafaxine (EFFEXOR) 25 MG tablet Take 25 mg  by mouth daily.    [provider]    Allergies    Patient has no known allergies.  Review of Systems   Review of Systems  Constitutional: Negative for fever.  Respiratory: Negative for shortness of breath.   Cardiovascular: Negative for chest pain.  Gastrointestinal: Negative for abdominal pain.  All other systems reviewed and are negative.   Physical Exam Updated Vital Signs BP 137/73   Pulse 70   Temp 98.8 F (37.1 C) (Oral)   Resp 18   Ht 1.676 m (5\' 6" )   Wt 59.9 kg   SpO2 98%   BMI 21.31 kg/m   Physical Exam Vitals and nursing note reviewed.  Constitutional:      General: She is not in acute distress.    Appearance: She is well-developed.  HENT:     Head: Normocephalic and atraumatic.     Right Ear: External ear normal.     Left Ear: External  ear normal.  Eyes:     General: No scleral icterus.       Right eye: No discharge.        Left eye: No discharge.     Conjunctiva/sclera: Conjunctivae normal.  Neck:     Trachea: No tracheal deviation.  Cardiovascular:     Rate and Rhythm: Normal rate and regular rhythm.  Pulmonary:     Effort: Pulmonary effort is normal. No respiratory distress.     Breath sounds: Normal breath sounds. No stridor. No wheezing or rales.  Abdominal:     General: Bowel sounds are normal. There is no distension.     Palpations: Abdomen is soft.     Tenderness: There is no abdominal tenderness. There is no guarding or rebound.  Musculoskeletal:        General: No tenderness.     Cervical back: Neck supple.  Skin:    General: Skin is warm and dry.     Findings: No rash.  Neurological:     Mental Status: She is alert and oriented to person, place, and time.     Cranial Nerves: No cranial nerve deficit (No facial droop, extraocular movements intact, tongue midline ).     Sensory: No sensory deficit.     Motor: No abnormal muscle tone or seizure activity.     Coordination: Coordination normal.     Comments: No pronator drift bilateral upper extrem, able to hold both legs off bed for 5 seconds, sensation intact in all extremities, no visual field cuts, no left or right sided neglect, normal finger-nose exam bilaterally, no nystagmus noted      ED Results / Procedures / Treatments   Labs (all labs ordered are listed, but only abnormal results are displayed) Labs Reviewed  CBC - Abnormal; Notable for the following components:      Result Value   WBC 3.8 (*)    Platelets 142 (*)    All other components within normal limits  COMPREHENSIVE METABOLIC PANEL - Abnormal; Notable for the following components:   Glucose, Bld 102 (*)    Calcium 8.8 (*)    Total Protein 6.0 (*)    All other components within normal limits  ETHANOL  PROTIME-INR  APTT  DIFFERENTIAL    EKG EKG  Interpretation  Date/Time:  Thursday Aug 03 2020 07:46:47 EDT Ventricular Rate:  84 PR Interval:  140 QRS Duration: 81 QT Interval:  389 QTC Calculation: 460 R Axis:   75 Text Interpretation: Sinus rhythm Consider right atrial enlargement  Since last tracing rate slower Confirmed by Dorie Rank [phone]) on 08/03/2020 7:48:07 AM   Radiology MR BRAIN WO CONTRAST  Result Date: 08/03/2020 CLINICAL DATA:  Possible seizure. EXAM: MRI HEAD WITHOUT CONTRAST TECHNIQUE: Multiplanar, multiecho pulse sequences of the brain and surrounding structures were obtained without intravenous contrast. COMPARISON:  None. FINDINGS: Brain: Dedicated thin section imaging through the temporal lobes demonstrates normal volume and signal of the hippocampi. There is no evidence of heterotopia or cortical dysplasia. There is no evidence of an acute infarct, intracranial hemorrhage, mass, midline shift, or extra-axial fluid collection. Mild cerebral atrophy is within normal limits for age. No significant white matter disease is seen for age. Vascular: Major intracranial vascular flow voids are preserved. Skull and upper cervical spine: Unremarkable bone marrow signal. Sinuses/Orbits: Unremarkable orbits. Minimal mucosal thickening in the maxillary and ethmoid sinuses. Clear mastoid air cells. Other: None. IMPRESSION: Unremarkable appearance of the brain for age. Electronically Signed   By: Logan Bores M.D.   On: 08/03/2020 09:35   VAS US CAROTID  Result Date: 08/03/2020 Carotid Arterial Duplex Study Patient Name:  Re[facility]  Date of Exam:   08/03/2020 Medical Rec #: [id number]              Accession #:    [id number] Date of Birth: 03/18/1951             Patient Gender: F Patient Age:   69Y Exam Location:  [facility] Procedure:      VAS US CAROTID Referring Phys: [id number] Adrinne Sze --------------------------------------------------------------------------------  Indications:       Syncope. Comparison Study:  no  prior Performing Technologist: Abram Sander RVS  Examination Guidelines: A complete evaluation includes B-mode imaging, spectral Doppler, color Doppler, and power Doppler as needed of all accessible portions of each vessel. Bilateral testing is considered an integral part of a complete examination. Limited examinations for reoccurring indications may be performed as noted.  Right Carotid Findings: +----------+--------+--------+--------+------------------+--------+           PSV cm/sEDV cm/sStenosisPlaque DescriptionComments +----------+--------+--------+--------+------------------+--------+ CCA Prox  69      15              heterogenous               +----------+--------+--------+--------+------------------+--------+ CCA Distal71      18              heterogenous               +----------+--------+--------+--------+------------------+--------+ ICA Prox  86      27      1-39%   heterogenous               +----------+--------+--------+--------+------------------+--------+ ICA Distal99      34                                         +----------+--------+--------+--------+------------------+--------+ ECA       71      9                                          +----------+--------+--------+--------+------------------+--------+ +----------+--------+-------+--------+-------------------+           PSV cm/sEDV cmsDescribeArm Pressure (mmHG) +----------+--------+-------+--------+-------------------+ [id number]                                         +----------+--------+-------+--------+-------------------+ +---------+--------+--+--------+--+---------+  VertebralPSV cm/s48EDV cm/s17Antegrade +---------+--------+--+--------+--+---------+  Left Carotid Findings: +----------+--------+--------+--------+------------------+--------+           PSV cm/sEDV cm/sStenosisPlaque DescriptionComments +----------+--------+--------+--------+------------------+--------+  CCA Prox  91      25              heterogenous               +----------+--------+--------+--------+------------------+--------+ CCA Distal104     35              heterogenous               +----------+--------+--------+--------+------------------+--------+ ICA Prox  69      25      1-39%   heterogenous               +----------+--------+--------+--------+------------------+--------+ ICA Distal109     45                                         +----------+--------+--------+--------+------------------+--------+ ECA       87      14                                         +----------+--------+--------+--------+------------------+--------+ +----------+--------+--------+--------+-------------------+           PSV cm/sEDV cm/sDescribeArm Pressure (mmHG) +----------+--------+--------+--------+-------------------+ [id number]                                          +----------+--------+--------+--------+-------------------+ +---------+--------+--+--------+--+---------+ VertebralPSV cm/s53EDV cm/s18Antegrade +---------+--------+--+--------+--+---------+   Summary: Right Carotid: Velocities in the right ICA are consistent with a 1-39% stenosis. Left Carotid: Velocities in the left ICA are consistent with a 1-39% stenosis. Vertebrals: Bilateral vertebral arteries demonstrate antegrade flow. *See table(s) above for measurements and observations.     Preliminary     Procedures Procedures   Medications Ordered in ED Medications  LORazepam (ATIVAN) injection 0.5 mg (has no administration in time range)    ED Course  I have reviewed the triage vital signs and the nursing notes.  Pertinent labs & imaging results that were available during my care of the patient were reviewed by me and considered in my medical decision making (see chart for details).  Clinical Course as of 08/03/20 1127  Thu Aug 03, 2020  1022 Brain MRI without acute findings [JK]  1023  Verbal report received from vascular tech Riddle.  No acute findings.  Normal [JK]    Clinical Course User Index [JK] Dorie Rank, MD   MDM Rules/Calculators/A&P                          Patient was sent to the ED for evaluation of possible syncopal versus seizure episode.  Patient has not any recurrent episodes since the weekend but she has had some recurrent prodrome.  Patient was referred for further outpatient work-up.   Patient was having continuous prodromal symptoms so she was told to come to the ED to expedite her evaluation.  In the ED the patient has a normal neurologic exam.  No findings to suggest stroke or TIA.  No cardiac dysrhythmia noted.  She has not had any seizure activity.  Patient did have an MRI in the ED  and there are no acute abnormalities.  She also had carotid vascular studies.  No signs of any blockages.  Unclear if the patient is having possible seizures versus cardiac syncopal episodes.    At this point I feel she is stable for continued outpatient evaluation. Final Clinical Impression(s) / ED Diagnoses Final diagnoses:  Seizure-like activity ([place])  Near syncope    Rx / DC Orders ED Discharge Orders    None       Dorie Rank, MD 08/03/20 1127

## 2020-08-03 NOTE — Discharge Instructions (Addendum)
The ED evaluation today was reassuring.  Your blood tests are normal.  The MRI did not show any signs of stroke or tumor.  The vascular ultrasound did not show any signs of blockages in your carotid arteries.  Please continue to follow-up with your doctor for further outpatient evaluation.

## 2020-08-03 NOTE — Progress Notes (Signed)
Carotid duplex has been completed.   Preliminary results in CV Proc.   Abram Sander 08/03/2020 11:25 AM

## 2020-08-03 NOTE — Telephone Encounter (Signed)
Received call from patient wanting some advice from Dr. Marin Olp. I informed her that he was out of the office the rest of the week through [facility].

## 2020-08-15 ENCOUNTER — Telehealth: Payer: Self-pay | Admitting: Neurology

## 2020-08-15 ENCOUNTER — Other Ambulatory Visit: Payer: Self-pay

## 2020-08-15 ENCOUNTER — Encounter: Payer: Self-pay | Admitting: Neurology

## 2020-08-15 ENCOUNTER — Ambulatory Visit (INDEPENDENT_AMBULATORY_CARE_PROVIDER_SITE_OTHER): Payer: Medicare HMO | Admitting: Neurology

## 2020-08-15 VITALS — BP 129/85 | HR 79 | Ht 66.0 in | Wt 130.5 lb

## 2020-08-15 DIAGNOSIS — R4020 Unspecified coma: Secondary | ICD-10-CM | POA: Diagnosis not present

## 2020-08-15 MED ORDER — DIVALPROEX SODIUM ER 500 MG PO TB24
500.0000 mg | ORAL_TABLET | Freq: Every day | ORAL | 11 refills | Status: DC
Start: 2020-08-15 — End: 2020-08-15

## 2020-08-15 MED ORDER — DIVALPROEX SODIUM ER 500 MG PO TB24: 500.0000 mg | ORAL_TABLET | Freq: Every day | ORAL | 11 refills | Status: DC
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
## 2020-08-15 NOTE — Telephone Encounter (Signed)
She already had an initial MRI without contrast on Aug 03, 2020 Emergency room on [place]: [place], I ordered MRI of the brain with contrast, may arrange it at same location?

## 2020-08-16 NOTE — Telephone Encounter (Signed)
I called [employer] [phone]) and spoke Rose to check CPT code [id number]. Rose directed me to call [employer] @ [phone]. Reference #[id number]. I called [employer] and spoke with Marybeth. She advised me to fax medical records to [phone]. Pending case #[id number].

## 2020-08-17 NOTE — Telephone Encounter (Signed)
I called [facility] scheduling @ [phone] and spoke with Vivien Rota. She checked with the radiologist and he states that they can't do MRI with only contrast. He states that because patient's last MRI was in May, we will need to order MRI with and without contrast.

## 2020-08-17 NOTE — Addendum Note (Signed)
Addended by: Marcial Pacas on: 08/17/2020 03:44 PM   Modules accepted: Orders

## 2020-08-17 NOTE — Telephone Encounter (Signed)
Received approval from [facility]. [facility] #[id number] (08/17/20- 02/13/21). Received approval from [facility]. Rafael Gonzalez #[id number] (08/17/20- 09/15/20).

## 2020-08-17 NOTE — Telephone Encounter (Signed)
Withdrew request with [employer], requested PA for MRI with and without contrast. PA #[id number]  (08/17/2020 - 10-08-2020). This PA covers exam codes [phone], [id number], [id number].

## 2020-08-17 NOTE — Telephone Encounter (Signed)
I reordered MRI brain w/wo  Orders Placed This Encounter  Procedures  . MR BRAIN W WO CONTRAST

## 2020-08-21 NOTE — Telephone Encounter (Signed)
I called [employer] @ [phone] and spoke with Erline Levine to change PA code. She states she was able to up-code the request to [phone] instead of [phone]. She states the Josem Kaufmann will remain the same for the [employer] approval. #[id number] (08/17/20- 02/13/21).   I called [facility] Scheduling @ [phone] and scheduled patient for MRI @ [place]. Patient needs to arrive to [facility] Radiology @ 3:30 on 6/16 for MRI at 4:00.

## 2020-08-23 ENCOUNTER — Ambulatory Visit (INDEPENDENT_AMBULATORY_CARE_PROVIDER_SITE_OTHER): Payer: Medicare HMO | Admitting: Neurology

## 2020-08-23 DIAGNOSIS — R4020 Unspecified coma: Secondary | ICD-10-CM | POA: Diagnosis not present

## 2020-08-31 ENCOUNTER — Encounter (HOSPITAL_COMMUNITY): Payer: Self-pay

## 2020-08-31 ENCOUNTER — Ambulatory Visit (HOSPITAL_COMMUNITY)
Admission: RE | Admit: 2020-08-31 | Discharge: 2020-08-31 | Disposition: A | Discharge status: Home / Self Care | Payer: Medicare HMO | Source: Ambulatory Visit | Attending: Neurology | Admitting: Neurology

## 2020-08-31 ENCOUNTER — Other Ambulatory Visit: Payer: Self-pay

## 2020-08-31 DIAGNOSIS — R4020 Unspecified coma: Secondary | ICD-10-CM
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
## 2020-08-31 NOTE — Procedures (Signed)
   HISTORY: 70 year old female, presented with seizure-like activity  TECHNIQUE:  This is a routine 16 channel EEG recording with one channel devoted to a limited EKG recording.  It was performed during wakefulness, drowsiness and asleep.  Hyperventilation and photic stimulation were performed as activating procedures.  There are minimum muscle and movement artifact noted.  Upon maximum arousal, posterior dominant waking rhythm consistent of rhythmic alpha range activity, with frequency of 9Hz . Activities are symmetric over the bilateral posterior derivations and attenuated with eye opening.  Hyperventilation produced mild/moderate buildup with higher amplitude and the slower activities noted.  Photic stimulation did not alter the tracing.  During EEG recording, patient developed drowsiness and entered sleep, sleep EEG demonstrated architecture, there were frontal centrally dominant vertex waves and symmetric sleep spindles noted.  During EEG recording, there was no epileptiform discharge noted.  EKG demonstrate sinus rhythm, with heart rate of 76 bpm    CONCLUSION: This is a  normal awake and asleep EEG.  There is no electrodiagnostic evidence of epileptiform discharge.  Marcial Pacas, M.D. Ph.D.  [facility] [place], [place] [address] Phone: [phone] Fax:      [phone]

## 2020-09-02 ENCOUNTER — Ambulatory Visit (HOSPITAL_COMMUNITY)
Admission: RE | Admit: 2020-09-02 | Discharge: 2020-09-02 | Disposition: A | Discharge status: Home / Self Care | Payer: Medicare HMO | Source: Ambulatory Visit | Attending: Neurology | Admitting: Neurology

## 2020-09-02 ENCOUNTER — Other Ambulatory Visit: Payer: Self-pay

## 2020-09-02 DIAGNOSIS — R4020 Unspecified coma: Secondary | ICD-10-CM | POA: Insufficient documentation

## 2020-09-02 DIAGNOSIS — R569 Unspecified convulsions: Secondary | ICD-10-CM | POA: Diagnosis not present

## 2020-09-02 DIAGNOSIS — G319 Degenerative disease of nervous system, unspecified: Secondary | ICD-10-CM | POA: Diagnosis not present

## 2020-09-02 MED ORDER — GADOBUTROL 1 MMOL/ML IV SOLN
6.0000 mL | Freq: Once | INTRAVENOUS | Status: AC | PRN
  Administered 2020-09-02: 6 mL via INTRAVENOUS

## 2020-09-05 ENCOUNTER — Telehealth: Payer: Self-pay | Admitting: *Deleted

## 2020-09-05 NOTE — Telephone Encounter (Addendum)
The patient has been provided with the results below. She would like a call from Dr. Krista Blue to further discuss. She can reached at [phone]. Telephone note generated in Epic and sent to MD.   IMPRESSION:  No evidence of acute intracranial abnormality.   No specific seizure focus is identified.   Mild generalized parenchymal atrophy, stable as compared to the brain MRI of 08/03/2020.

## 2020-09-05 NOTE — Telephone Encounter (Signed)
The patient has been provided with the results below. She would like a call from Dr. Krista Blue to further discuss. She can reached at [phone].   IMPRESSION:  No evidence of acute intracranial abnormality.   No specific seizure focus is identified.   Mild generalized parenchymal atrophy, stable as compared to the brain MRI of 08/03/2020.

## 2020-09-27 DIAGNOSIS — R42 Dizziness and giddiness: Secondary | ICD-10-CM | POA: Diagnosis not present

## 2020-09-27 DIAGNOSIS — H9313 Tinnitus, bilateral: Secondary | ICD-10-CM | POA: Diagnosis not present

## 2020-09-27 DIAGNOSIS — H903 Sensorineural hearing loss, bilateral: Secondary | ICD-10-CM | POA: Diagnosis not present

## 2020-10-04 ENCOUNTER — Telehealth: Payer: Self-pay | Admitting: Hematology & Oncology

## 2020-10-04 ENCOUNTER — Inpatient Hospital Stay: Payer: Medicare HMO | Attending: Hematology & Oncology

## 2020-10-04 ENCOUNTER — Inpatient Hospital Stay (HOSPITAL_BASED_OUTPATIENT_CLINIC_OR_DEPARTMENT_OTHER): Payer: Medicare HMO | Admitting: Hematology & Oncology

## 2020-10-04 ENCOUNTER — Encounter: Payer: Self-pay | Admitting: Hematology & Oncology

## 2020-10-04 ENCOUNTER — Other Ambulatory Visit: Payer: Self-pay

## 2020-10-04 VITALS — BP 130/82 | HR 72 | Temp 98.2°F | Resp 17 | Wt 129.0 lb

## 2020-10-04 DIAGNOSIS — H9190 Unspecified hearing loss, unspecified ear: Secondary | ICD-10-CM | POA: Insufficient documentation

## 2020-10-04 DIAGNOSIS — R4020 Unspecified coma: Secondary | ICD-10-CM | POA: Diagnosis not present

## 2020-10-04 DIAGNOSIS — Z853 Personal history of malignant neoplasm of breast: Secondary | ICD-10-CM | POA: Insufficient documentation

## 2020-10-04 DIAGNOSIS — R55 Syncope and collapse: Secondary | ICD-10-CM | POA: Insufficient documentation

## 2020-10-04 DIAGNOSIS — Z9221 Personal history of antineoplastic chemotherapy: Secondary | ICD-10-CM | POA: Insufficient documentation

## 2020-10-04 DIAGNOSIS — Z8616 Personal history of COVID-19: Secondary | ICD-10-CM | POA: Insufficient documentation

## 2020-10-04 DIAGNOSIS — Z9011 Acquired absence of right breast and nipple: Secondary | ICD-10-CM | POA: Insufficient documentation

## 2020-10-04 DIAGNOSIS — M818 Other osteoporosis without current pathological fracture: Secondary | ICD-10-CM

## 2020-10-04 LAB — CMP (CANCER CENTER ONLY)
ALT: 18 U/L (ref 0–44)
AST: 18 U/L (ref 15–41)
Albumin: 4.4 g/dL (ref 3.5–5.0)
Alkaline Phosphatase: 48 U/L (ref 38–126)
Anion gap: 6 (ref 5–15)
BUN: 23 mg/dL (ref 8–23)
CO2: 30 mmol/L (ref 22–32)
Calcium: 9.9 mg/dL (ref 8.9–10.3)
Chloride: 104 mmol/L (ref 98–111)
Creatinine: 0.88 mg/dL (ref 0.44–1.00)
GFR, Estimated: >60 mL/min (ref >60)
Glucose, Bld: 82 mg/dL (ref 70–99)
Potassium: 4.2 mmol/L (ref 3.5–5.1)
Sodium: 140 mmol/L (ref 135–145)
Total Bilirubin: 0.6 mg/dL (ref 0.3–1.2)
Total Protein: 6.7 g/dL (ref 6.5–8.1)

## 2020-10-04 LAB — CBC WITH DIFFERENTIAL (CANCER CENTER ONLY)
Abs Immature Granulocytes: 0.01 10*3/uL (ref 0.00–0.07)
Basophils Absolute: 0.1 10*3/uL (ref 0.0–0.1)
Basophils Relative: 1 %
Eosinophils Absolute: 0.2 10*3/uL (ref 0.0–0.5)
Eosinophils Relative: 5 %
HCT: 41.7 % (ref 36.0–46.0)
Hemoglobin: 14.1 g/dL (ref 12.0–15.0)
Immature Granulocytes: 0 %
Lymphocytes Relative: 29 %
Lymphs Abs: 1.1 10*3/uL (ref 0.7–4.0)
MCH: 32 pg (ref 26.0–34.0)
MCHC: 33.8 g/dL (ref 30.0–36.0)
MCV: 94.6 fL (ref 80.0–100.0)
Monocytes Absolute: 0.5 10*3/uL (ref 0.1–1.0)
Monocytes Relative: 13 %
Neutro Abs: 2 10*3/uL (ref 1.7–7.7)
Neutrophils Relative %: 52 %
Platelet Count: 274 10*3/uL (ref 150–400)
RBC: 4.41 MIL/uL (ref 3.87–5.11)
RDW: 11.6 % (ref 11.5–15.5)
WBC Count: 3.9 10*3/uL — ABNORMAL LOW (ref 4.0–10.5)
nRBC: 0 % (ref 0.0–0.2)

## 2020-10-04 LAB — VITAMIN D 25 HYDROXY (VIT D DEFICIENCY, FRACTURES): Vit D, 25-Hydroxy: 49.1 ng/mL (ref 30–100)
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
## 2020-10-16 ENCOUNTER — Encounter (HOSPITAL_BASED_OUTPATIENT_CLINIC_OR_DEPARTMENT_OTHER): Payer: Self-pay | Admitting: Cardiology

## 2020-10-16 ENCOUNTER — Other Ambulatory Visit: Payer: Self-pay

## 2020-10-16 ENCOUNTER — Ambulatory Visit (INDEPENDENT_AMBULATORY_CARE_PROVIDER_SITE_OTHER): Payer: Medicare HMO | Admitting: Cardiology

## 2020-10-16 VITALS — BP 128/80 | HR 67 | Ht 66.0 in | Wt 131.6 lb

## 2020-10-16 DIAGNOSIS — Z7189 Other specified counseling: Secondary | ICD-10-CM

## 2020-10-16 DIAGNOSIS — R55 Syncope and collapse: Secondary | ICD-10-CM
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
## 2020-10-16 NOTE — Patient Instructions (Signed)
Medication Instructions:  Your Physician recommend you continue on your current medication as directed.    *If you need a refill on your cardiac medications before your next appointment, please call your pharmacy*   Lab Work: None ordered today   Testing/Procedures: Your physician has requested that you have an echocardiogram. Echocardiography is a painless test that uses sound waves to create images of your heart. It provides your doctor with information about the size and shape of your heart and how well your heart's chambers and valves are working. This procedure takes approximately one hour. There are no restrictions for this procedure. [place] [address]    Follow-Up: At [facility], you and your health needs are our priority.  As part of our continuing mission to provide you with exceptional heart care, we have created designated Provider Care Teams.  These Care Teams include your primary Cardiologist (physician) and Advanced Practice Providers (APPs -  Physician Assistants and Nurse Practitioners) who all work together to provide you with the care you need, when you need it.  We recommend signing up for the patient portal called "MyChart".  Sign up information is provided on this After Visit Summary.  MyChart is used to connect with patients for Virtual Visits (Telemedicine).  Patients are able to view lab/test results, encounter notes, upcoming appointments, etc.  Non-urgent messages can be sent to your provider as well.   To learn more about what you can do with MyChart, go to [email or web address].    Your next appointment:   As needed  The format for your next appointment:   In Person  Provider:   Buford Dresser, MD

## 2020-10-17 ENCOUNTER — Ambulatory Visit: Payer: Medicare HMO | Admitting: Neurology

## 2020-10-18 DIAGNOSIS — R2681 Unsteadiness on feet: Secondary | ICD-10-CM | POA: Diagnosis not present

## 2020-10-18 DIAGNOSIS — R55 Syncope and collapse: Secondary | ICD-10-CM | POA: Diagnosis not present

## 2020-10-18 DIAGNOSIS — R42 Dizziness and giddiness: Secondary | ICD-10-CM | POA: Diagnosis not present

## 2020-10-26 ENCOUNTER — Other Ambulatory Visit: Payer: Self-pay

## 2020-10-26 ENCOUNTER — Ambulatory Visit (HOSPITAL_COMMUNITY): Payer: Medicare HMO | Attending: Cardiology

## 2020-10-26 DIAGNOSIS — R55 Syncope and collapse: Secondary | ICD-10-CM

## 2020-10-26 LAB — ECHOCARDIOGRAM COMPLETE
Area-P 1/2: 3.53 cm2
MV M vel: 5.81 m/s
MV Peak grad: 134.8 mmHg
S' Lateral: 2.8 cm

## 2020-11-07 ENCOUNTER — Encounter (HOSPITAL_BASED_OUTPATIENT_CLINIC_OR_DEPARTMENT_OTHER): Payer: Self-pay

## 2020-11-14 DIAGNOSIS — H903 Sensorineural hearing loss, bilateral: Secondary | ICD-10-CM | POA: Diagnosis not present

## 2020-12-15 DIAGNOSIS — H5203 Hypermetropia, bilateral: Secondary | ICD-10-CM | POA: Diagnosis not present

## 2020-12-15 DIAGNOSIS — H25812 Combined forms of age-related cataract, left eye: Secondary | ICD-10-CM | POA: Diagnosis not present

## 2020-12-15 DIAGNOSIS — H2511 Age-related nuclear cataract, right eye: Secondary | ICD-10-CM | POA: Diagnosis not present

## 2020-12-15 DIAGNOSIS — H524 Presbyopia: Secondary | ICD-10-CM | POA: Diagnosis not present

## 2020-12-15 DIAGNOSIS — H52223 Regular astigmatism, bilateral: Secondary | ICD-10-CM | POA: Diagnosis not present

## 2020-12-15 DIAGNOSIS — H43813 Vitreous degeneration, bilateral: Secondary | ICD-10-CM | POA: Diagnosis not present

## 2020-12-18 ENCOUNTER — Ambulatory Visit (INDEPENDENT_AMBULATORY_CARE_PROVIDER_SITE_OTHER): Payer: Medicare HMO | Admitting: Neurology

## 2020-12-18 ENCOUNTER — Encounter: Payer: Self-pay | Admitting: Neurology

## 2020-12-18 VITALS — BP 124/68 | HR 68 | Ht 66.0 in | Wt 132.5 lb

## 2020-12-18 DIAGNOSIS — R4020 Unspecified coma: Secondary | ICD-10-CM | POA: Diagnosis not present
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
## 2020-12-20 DIAGNOSIS — L818 Other specified disorders of pigmentation: Secondary | ICD-10-CM | POA: Diagnosis not present

## 2020-12-20 DIAGNOSIS — L821 Other seborrheic keratosis: Secondary | ICD-10-CM | POA: Diagnosis not present

## 2020-12-20 DIAGNOSIS — Z85828 Personal history of other malignant neoplasm of skin: Secondary | ICD-10-CM | POA: Diagnosis not present

## 2020-12-20 DIAGNOSIS — Z08 Encounter for follow-up examination after completed treatment for malignant neoplasm: Secondary | ICD-10-CM | POA: Diagnosis not present

## 2020-12-20 DIAGNOSIS — D225 Melanocytic nevi of trunk: Secondary | ICD-10-CM | POA: Diagnosis not present

## 2020-12-20 DIAGNOSIS — L814 Other melanin hyperpigmentation: Secondary | ICD-10-CM | POA: Diagnosis not present

## 2020-12-27 DIAGNOSIS — Z1231 Encounter for screening mammogram for malignant neoplasm of breast: Secondary | ICD-10-CM | POA: Diagnosis not present

## 2021-01-15 ENCOUNTER — Ambulatory Visit: Payer: Medicare HMO | Admitting: Neurology

## 2021-01-17 DIAGNOSIS — H25812 Combined forms of age-related cataract, left eye: Secondary | ICD-10-CM | POA: Diagnosis not present

## 2021-01-17 DIAGNOSIS — H524 Presbyopia: Secondary | ICD-10-CM | POA: Diagnosis not present

## 2021-01-17 DIAGNOSIS — H2511 Age-related nuclear cataract, right eye: Secondary | ICD-10-CM | POA: Diagnosis not present

## 2021-01-17 DIAGNOSIS — H43813 Vitreous degeneration, bilateral: Secondary | ICD-10-CM | POA: Diagnosis not present

## 2021-01-17 DIAGNOSIS — H52223 Regular astigmatism, bilateral: Secondary | ICD-10-CM | POA: Diagnosis not present

## 2021-01-17 DIAGNOSIS — H5203 Hypermetropia, bilateral: Secondary | ICD-10-CM | POA: Diagnosis not present

## 2021-01-18 DIAGNOSIS — H52223 Regular astigmatism, bilateral: Secondary | ICD-10-CM | POA: Diagnosis not present

## 2021-01-18 DIAGNOSIS — H5203 Hypermetropia, bilateral: Secondary | ICD-10-CM | POA: Diagnosis not present

## 2021-01-18 DIAGNOSIS — H524 Presbyopia: Secondary | ICD-10-CM | POA: Diagnosis not present

## 2021-01-18 DIAGNOSIS — H25812 Combined forms of age-related cataract, left eye: Secondary | ICD-10-CM | POA: Diagnosis not present

## 2021-01-18 DIAGNOSIS — H2511 Age-related nuclear cataract, right eye: Secondary | ICD-10-CM | POA: Diagnosis not present

## 2021-01-18 DIAGNOSIS — H43813 Vitreous degeneration, bilateral: Secondary | ICD-10-CM | POA: Diagnosis not present

## 2021-01-31 DIAGNOSIS — E782 Mixed hyperlipidemia: Secondary | ICD-10-CM | POA: Diagnosis not present

## 2021-01-31 DIAGNOSIS — Z1389 Encounter for screening for other disorder: Secondary | ICD-10-CM | POA: Diagnosis not present

## 2021-01-31 DIAGNOSIS — M797 Fibromyalgia: Secondary | ICD-10-CM | POA: Diagnosis not present

## 2021-01-31 DIAGNOSIS — Z5320 Procedure and treatment not carried out because of patient's decision for unspecified reasons: Secondary | ICD-10-CM | POA: Diagnosis not present

## 2021-01-31 DIAGNOSIS — Z23 Encounter for immunization: Secondary | ICD-10-CM | POA: Diagnosis not present

## 2021-01-31 DIAGNOSIS — M8580 Other specified disorders of bone density and structure, unspecified site: Secondary | ICD-10-CM | POA: Diagnosis not present

## 2021-01-31 DIAGNOSIS — Z0000 Encounter for general adult medical examination without abnormal findings: Secondary | ICD-10-CM | POA: Diagnosis not present

## 2021-01-31 DIAGNOSIS — Z853 Personal history of malignant neoplasm of breast: Secondary | ICD-10-CM | POA: Diagnosis not present

## 2021-03-02 DIAGNOSIS — K5520 Angiodysplasia of colon without hemorrhage: Secondary | ICD-10-CM | POA: Diagnosis not present

## 2021-03-02 DIAGNOSIS — K648 Other hemorrhoids: Secondary | ICD-10-CM | POA: Diagnosis not present

## 2021-03-02 DIAGNOSIS — Z1211 Encounter for screening for malignant neoplasm of colon: Secondary | ICD-10-CM | POA: Diagnosis not present

## 2021-03-02 DIAGNOSIS — D123 Benign neoplasm of transverse colon: Secondary | ICD-10-CM | POA: Diagnosis not present

## 2021-03-06 DIAGNOSIS — D123 Benign neoplasm of transverse colon: Secondary | ICD-10-CM | POA: Diagnosis not present

## 2021-03-26 DIAGNOSIS — H903 Sensorineural hearing loss, bilateral: Secondary | ICD-10-CM | POA: Diagnosis not present

## 2021-08-30 DIAGNOSIS — C50911 Malignant neoplasm of unspecified site of right female breast: Secondary | ICD-10-CM | POA: Diagnosis not present

## 2021-09-13 DIAGNOSIS — C50911 Malignant neoplasm of unspecified site of right female breast: Secondary | ICD-10-CM | POA: Diagnosis not present

## 2021-09-21 ENCOUNTER — Ambulatory Visit
Admission: RE | Admit: 2021-09-21 | Discharge: 2021-09-21 | Disposition: A | Discharge status: Home / Self Care | Payer: PPO | Source: Ambulatory Visit | Attending: Family Medicine | Admitting: Family Medicine

## 2021-09-21 ENCOUNTER — Other Ambulatory Visit: Payer: Self-pay | Admitting: Family Medicine

## 2021-09-21 DIAGNOSIS — R059 Cough, unspecified: Secondary | ICD-10-CM

## 2021-09-21 DIAGNOSIS — Z853 Personal history of malignant neoplasm of breast: Secondary | ICD-10-CM | POA: Diagnosis not present

## 2021-09-26 IMAGING — MR MR HEAD WO/W CM
8 of 15 series · 23 of 48 positions shown · IV contrast (gadavist)
Comparison: Noncontrast brain MRI 08/03/2020.

CLINICAL DATA: Loss of consciousness.  Seizure, nontraumatic.

EXAM:
MRI HEAD WITHOUT AND WITH CONTRAST
TECHNIQUE: Multiplanar, multiecho pulse sequences of the brain and surrounding
structures were obtained without and with intravenous contrast.
CONTRAST:  6mL GADAVIST GADOBUTROL 1 MMOL/ML IV SOLN

[Series 2: DWI · axial · 3.0mm · 0.94mm/px · z∈[-81,+66]mm · 7 of 100 slices shown (1 of 2)]
[im 1/100]
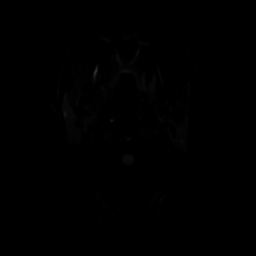
[im 17/100]
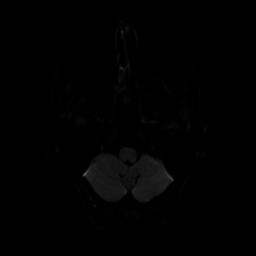
[im 34/100]
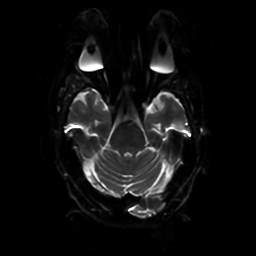
[im 50/100]
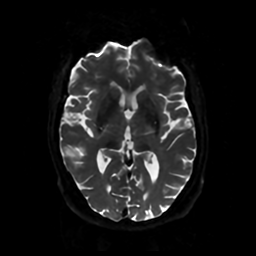
[im 67/100]
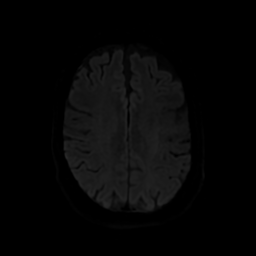
[im 83/100]
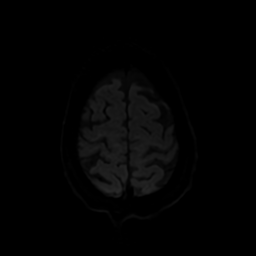
[im 100/100]
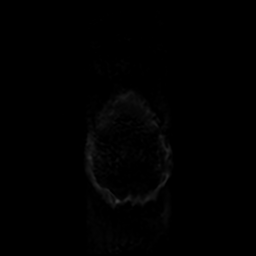

[Series 3: DWI · coronal · 4.0mm · 0.94mm/px · 5 of 74 slices shown (2 of 2)]
[im 1/74]
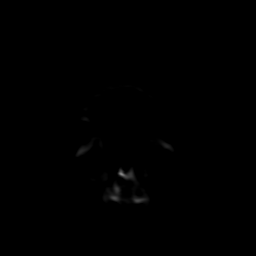
[im 19/74]
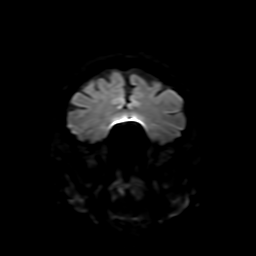
[im 37/74]
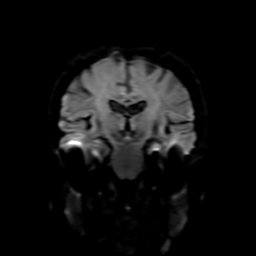
[im 55/74]
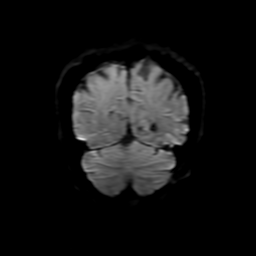
[im 74/74]
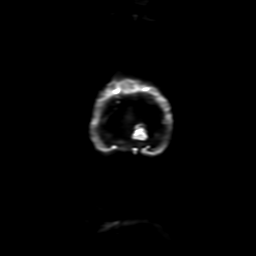

[Series 4: FLAIR · sagittal · 5.0mm · 0.23mm/px · 1 of 24 slices shown (1 of 3)]
[im 1/24]
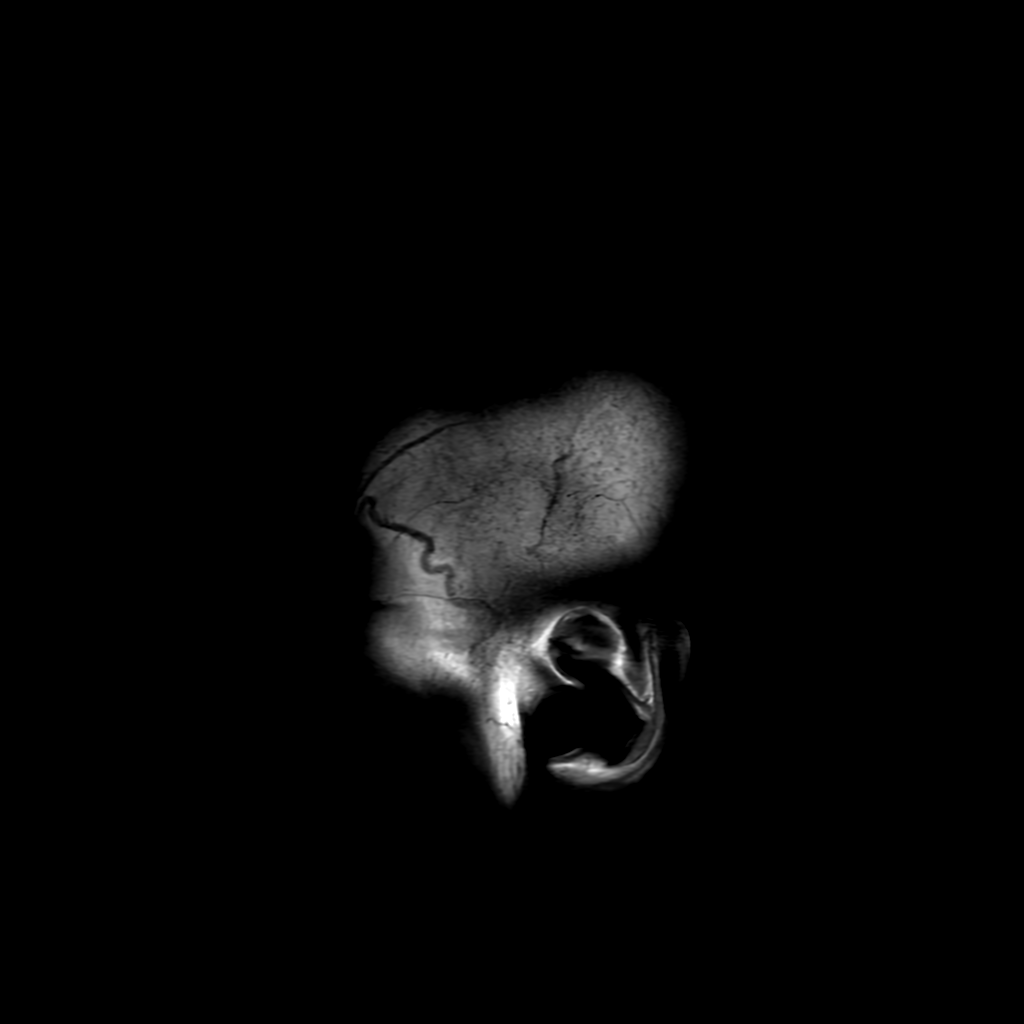

[Series 5: T2 · axial · 5.0mm · 0.23mm/px · 1 of 26 slices shown]
[im 1/26]
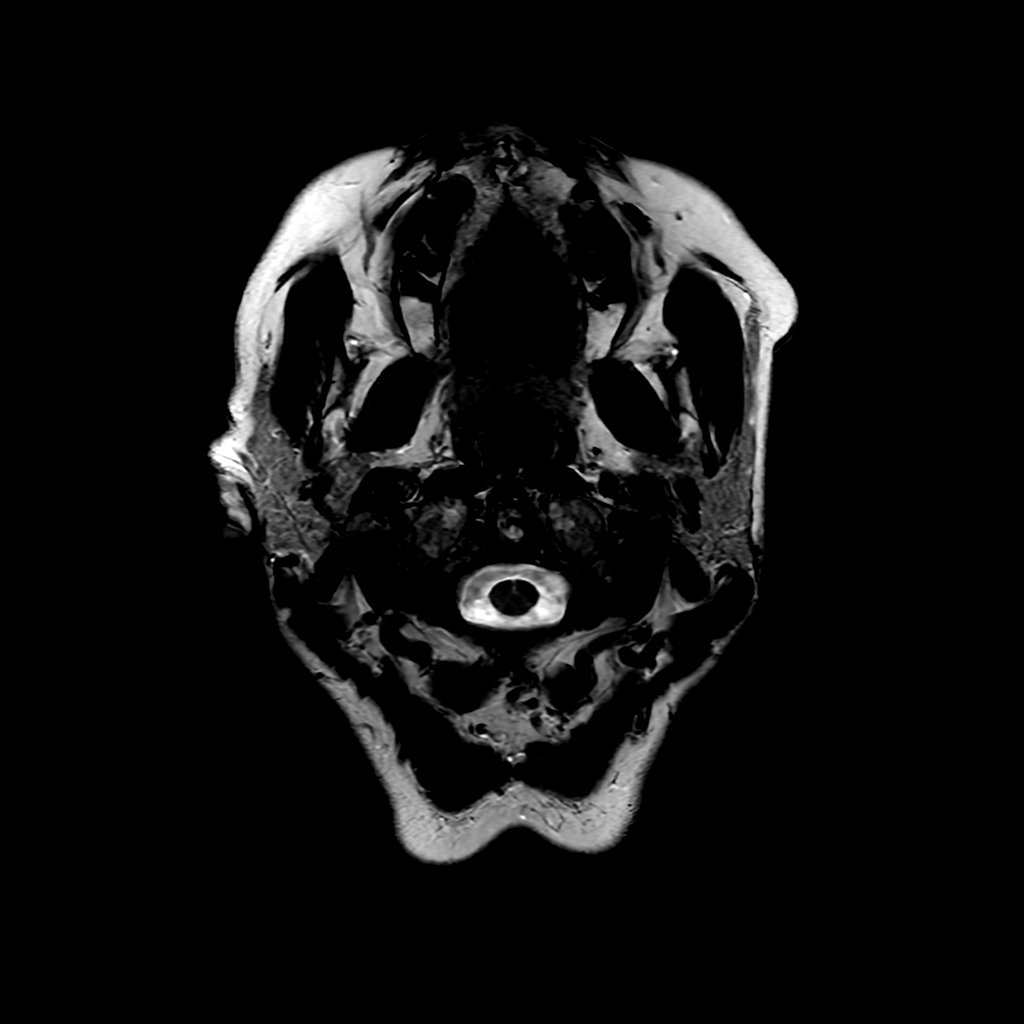

[Series 6: FLAIR · axial · 4.0mm · 0.45mm/px · z∈[-82,+67]mm · 2 of 35 slices shown (2 of 3)]
[im 1/35]
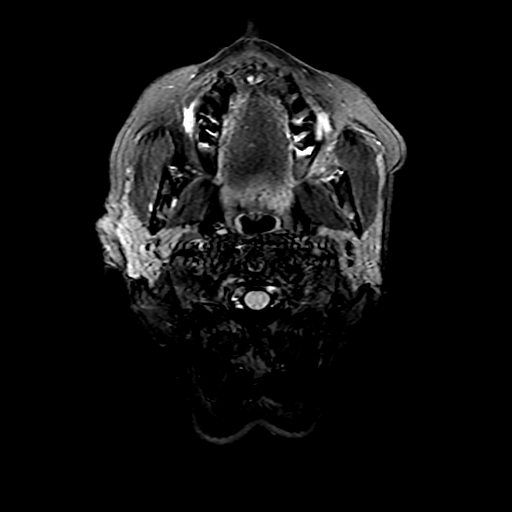
[im 35/35]
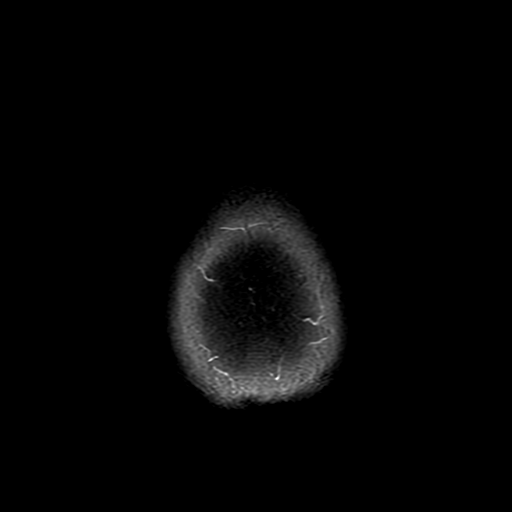

[Series 10: FLAIR · coronal · 3.0mm · 0.39mm/px · 2 of 29 slices shown (3 of 3)]
[im 1/29]
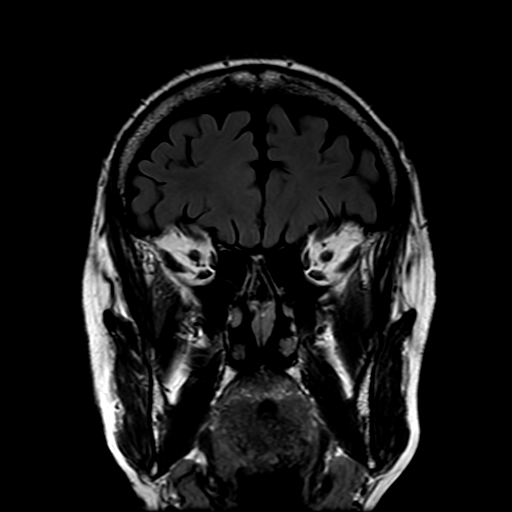
[im 29/29]
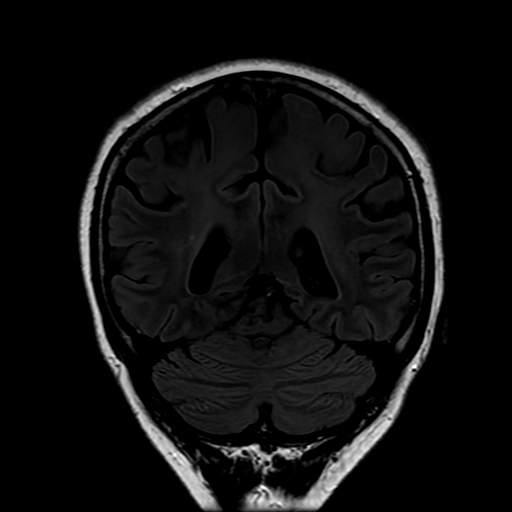

[Series 250: ADC · axial · 3.0mm · 0.94mm/px · z∈[-81,+66]mm · 3 of 50 slices shown (1 of 2)]
[im 1/50]
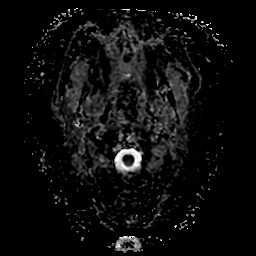
[im 25/50]
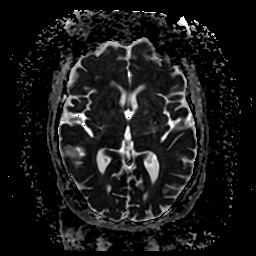
[im 50/50]
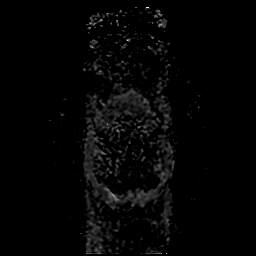

[Series 350: ADC · coronal · 4.0mm · 0.94mm/px · 2 of 36 slices shown (2 of 2)]
[im 1/36]
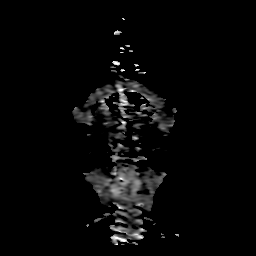
[im 36/36]
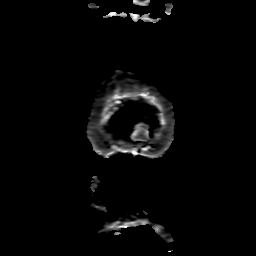

[23 of 48 positions shown; findings below may reference images not displayed]

FINDINGS: Brain:

Mild generalized cerebral and cerebellar atrophy.

No cortical encephalomalacia is identified. No significant white
matter disease for age.

The hippocampi are symmetric in size and signal.

There is no acute infarct.

No evidence of intracranial mass.

No chronic intracranial blood products.

No extra-axial fluid collection.

No midline shift.

No abnormal intracranial enhancement.

Vascular: Expected proximal arterial flow voids.

Skull and upper cervical spine: Unchanged subcentimeter T2
hyperintense focus within the odontoid process, well demarcated and
nonaggressive in appearance, possibly degenerative in etiology.

Sinuses/Orbits: Visualized orbits show no acute finding. Trace
bilateral ethmoid sinus mucosal thickening.

Other: Minimal fluid within the right mastoid air cells.
IMPRESSION: No evidence of acute intracranial abnormality.

No specific seizure focus is identified.

Mild generalized parenchymal atrophy, stable as compared to the
brain MRI of 08/03/2020.

## 2021-10-04 ENCOUNTER — Inpatient Hospital Stay (HOSPITAL_BASED_OUTPATIENT_CLINIC_OR_DEPARTMENT_OTHER): Payer: PPO | Admitting: Hematology & Oncology

## 2021-10-04 ENCOUNTER — Inpatient Hospital Stay: Payer: PPO | Attending: Hematology & Oncology

## 2021-10-04 ENCOUNTER — Encounter: Payer: Self-pay | Admitting: Hematology & Oncology

## 2021-10-04 VITALS — BP 128/81 | HR 65 | Temp 98.2°F | Resp 20 | Ht 65.5 in | Wt 129.1 lb

## 2021-10-04 DIAGNOSIS — Z853 Personal history of malignant neoplasm of breast: Secondary | ICD-10-CM | POA: Insufficient documentation

## 2021-10-04 DIAGNOSIS — C50011 Malignant neoplasm of nipple and areola, right female breast: Secondary | ICD-10-CM | POA: Diagnosis not present

## 2021-10-04 DIAGNOSIS — R4020 Unspecified coma: Secondary | ICD-10-CM

## 2021-10-04 LAB — CMP (CANCER CENTER ONLY)
ALT: 17 U/L (ref 0–44)
AST: 17 U/L (ref 15–41)
Albumin: 4.5 g/dL (ref 3.5–5.0)
Alkaline Phosphatase: 59 U/L (ref 38–126)
Anion gap: 7 (ref 5–15)
BUN: 21 mg/dL (ref 8–23)
CO2: 30 mmol/L (ref 22–32)
Calcium: 9.5 mg/dL (ref 8.9–10.3)
Chloride: 103 mmol/L (ref 98–111)
Creatinine: 0.89 mg/dL (ref 0.44–1.00)
GFR, Estimated: >60 mL/min (ref >60)
Glucose, Bld: 103 mg/dL — ABNORMAL HIGH (ref 70–99)
Potassium: 4 mmol/L (ref 3.5–5.1)
Sodium: 140 mmol/L (ref 135–145)
Total Bilirubin: 0.6 mg/dL (ref 0.3–1.2)
Total Protein: 6.6 g/dL (ref 6.5–8.1)

## 2021-10-04 LAB — LACTATE DEHYDROGENASE: LDH: 129 U/L (ref 98–192)

## 2021-10-04 LAB — CBC WITH DIFFERENTIAL (CANCER CENTER ONLY)
Abs Immature Granulocytes: 0.01 10*3/uL (ref 0.00–0.07)
Basophils Absolute: 0 10*3/uL (ref 0.0–0.1)
Basophils Relative: 1 %
Eosinophils Absolute: 0.2 10*3/uL (ref 0.0–0.5)
Eosinophils Relative: 4 %
HCT: 39.8 % (ref 36.0–46.0)
Hemoglobin: 13.2 g/dL (ref 12.0–15.0)
Immature Granulocytes: 0 %
Lymphocytes Relative: 25 %
Lymphs Abs: 1.3 10*3/uL (ref 0.7–4.0)
MCH: 31.3 pg (ref 26.0–34.0)
MCHC: 33.2 g/dL (ref 30.0–36.0)
MCV: 94.3 fL (ref 80.0–100.0)
Monocytes Absolute: 0.5 10*3/uL (ref 0.1–1.0)
Monocytes Relative: 10 %
Neutro Abs: 3.1 10*3/uL (ref 1.7–7.7)
Neutrophils Relative %: 60 %
Platelet Count: 250 10*3/uL (ref 150–400)
RBC: 4.22 MIL/uL (ref 3.87–5.11)
RDW: 11.8 % (ref 11.5–15.5)
WBC Count: 5.2 10*3/uL (ref 4.0–10.5)
nRBC: 0 % (ref 0.0–0.2)

## 2021-10-04 NOTE — Progress Notes (Signed)
Hematology and Oncology Follow Up Visit  Rebekah Merritt [id number] 1950/09/30 71 y.o. 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  She looks quite good.  She has had no problems with her heart.  There is been no issues with syncope.  She has had no issues with fever.  There is been no nausea or vomiting.  She has had no change in bowel or bladder habits.  Think she does have a synovial cyst or ganglion cyst in the palm of her right hand.  It causes little bit of discomfort.  I did give her the name of Dr. Amedeo Plenty  of Orthopedic Surgery that she can see if she needed to.  She has had no rashes.  There is been no bleeding.  She has had no headache.  Overall, I would say performance status is probably ECOG 0.      Medications:  Allergies as of 10/04/2021       Reactions   Latex Dermatitis        Medication List        Accurate as of October 04, 2021 10:16 AM. If you have any questions, ask your nurse or doctor.          albuterol 108 (90 Base) MCG/ACT inhaler Commonly known as: VENTOLIN HFA SMARTSIG:1 Puff(s) Via Inhaler Every 6 Hours PRN   CALCIUM MAGNESIUM PO Take by mouth.   Multi-Vitamin Daily Tabs Take 1 tablet by mouth daily.   Vitamin B-12 6000 MCG Subl Place 1 tablet under the tongue daily.   VITAMIN D PO Take 1 tablet by mouth daily. W/ K2-        Vitamin D 1000 units.        Allergies:  Allergies  Allergen Reactions   Latex Dermatitis    Past Medical History, Surgical history, Social history, and Family History were reviewed and updated.  Review of Systems: All other 10 point review of systems is negative.   Physical Exam:  height is 5' 5.5" (1.664 m) and weight is 129 lb 1.9 oz (58.6 kg). Her temperature is 98.2 F (36.8 C). Her blood pressure is 128/81 and her pulse is  65. Her respiration is 20 and oxygen saturation is 99%.   Wt Readings from Last 3 Encounters:  10/04/21 129 lb 1.9 oz (58.6 kg)  12/18/20 132 lb 8 oz (60.1 kg)  10/16/20 131 lb 9.6 oz (59.7 kg)    Physical Exam Vitals reviewed.  Constitutional:      Comments: .  She has well-healed mastectomy on the right side.  There is no erythema or nodularity.  There is no right axillary adenopathy.  HENT:     Head: Normocephalic and atraumatic.  Eyes:     Pupils: Pupils are equal, round, and reactive to light.  Cardiovascular:     Rate and Rhythm: Normal rate and regular rhythm.     Heart sounds: Normal heart sounds.  Pulmonary:     Effort: Pulmonary effort is normal.     Breath sounds: Normal breath sounds.  Abdominal:     General: Bowel sounds are normal.     Palpations: Abdomen is soft.     Comments: She has well-healed laparoscopy scars.  There is no fluid wave.  There is no guarding or rebound tenderness.  Musculoskeletal:  General: No tenderness or deformity. Normal range of motion.     Cervical back: Normal range of motion.  Lymphadenopathy:     Cervical: No cervical adenopathy.  Skin:    General: Skin is warm and dry.     Findings: No erythema or rash.  Neurological:     Mental Status: She is alert and oriented to person, place, and time.  Psychiatric:        Behavior: Behavior normal.        Thought Content: Thought content normal.        Judgment: Judgment normal.    Breast exam shows left breast with no masses, edema or erythema.  There is no left axillary adenopathy.  Right chest wall is well-healed  Lab Results  Component Value Date   WBC 5.2 10/04/2021   HGB 13.2 10/04/2021   HCT 39.8 10/04/2021   MCV 94.3 10/04/2021   PLT 250 10/04/2021   No results found for: "FERRITIN", "IRON", "TIBC", "UIBC", "IRONPCTSAT" Lab Results  Component Value Date   RBC 4.22 10/04/2021   No results found for: "KPAFRELGTCHN", "LAMBDASER", "KAPLAMBRATIO" No results found  for: "IGGSERUM", "IGA", "IGMSERUM" No results found for: "TOTALPROTELP", "ALBUMINELP", "A1GS", "A2GS", "BETS", "BETA2SER", "GAMS", "MSPIKE", "SPEI"   Chemistry      Component Value Date/Time   NA 140 10/04/2021 0849   NA 141 10/08/2016 0956   NA 141 10/12/2015 0858   K 4.0 10/04/2021 0849   K 4.6 10/08/2016 0956   K 4.8 10/12/2015 0858   CL 103 10/04/2021 0849   CL 102 10/08/2016 0956   CO2 30 10/04/2021 0849   CO2 30 10/08/2016 0956   CO2 27 10/12/2015 0858   BUN 21 10/04/2021 0849   BUN 18 10/08/2016 0956   BUN 21.6 10/12/2015 0858   CREATININE 0.89 10/04/2021 0849   CREATININE 0.9 10/08/2016 0956   CREATININE 0.8 10/12/2015 0858      Component Value Date/Time   CALCIUM 9.5 10/04/2021 0849   CALCIUM 9.1 10/08/2016 0956   CALCIUM 8.9 10/12/2015 0858   ALKPHOS 59 10/04/2021 0849   ALKPHOS 56 10/08/2016 0956   ALKPHOS 68 10/12/2015 0858   AST 17 10/04/2021 0849   AST 28 10/12/2015 0858   ALT 17 10/04/2021 0849   ALT 39 10/08/2016 0956   ALT 32 10/12/2015 0858   BILITOT 0.6 10/04/2021 0849   BILITOT 0.41 10/12/2015 0858      Impression and Plan: Rebekah Merritt is a very pleasant 71 yo caucasian female with history of stage IIA ductal carcinoma the right breast, ER negative and one positive lymph node. She completed treatment with adjuvant chemotherapy over 21 years ago.   She has mentioned to me that she had a chest x-ray recently.  She had a dry cough.  The x-ray did not show anything of what she says.  Her family doctor put her on some albuterol this seemed to help.  Everything else looks quite good.  I am glad that she has not had any issues with further syncopal episodes.  We will still get her back in 1 year.  I know that she will have a wonderful time at the beach with her husband.  I told her to make sure that she is hydrated and wear sunscreen.     Volanda Napoleon, MD 7/20/202310:16 AM

## 2022-01-02 DIAGNOSIS — Z1231 Encounter for screening mammogram for malignant neoplasm of breast: Secondary | ICD-10-CM | POA: Diagnosis not present

## 2022-01-28 DIAGNOSIS — D219 Benign neoplasm of connective and other soft tissue, unspecified: Secondary | ICD-10-CM | POA: Diagnosis not present

## 2022-01-28 DIAGNOSIS — M13841 Other specified arthritis, right hand: Secondary | ICD-10-CM | POA: Diagnosis not present

## 2022-01-28 DIAGNOSIS — M79641 Pain in right hand: Secondary | ICD-10-CM | POA: Diagnosis not present

## 2022-02-19 DIAGNOSIS — Z1331 Encounter for screening for depression: Secondary | ICD-10-CM | POA: Diagnosis not present

## 2022-02-19 DIAGNOSIS — Z853 Personal history of malignant neoplasm of breast: Secondary | ICD-10-CM | POA: Diagnosis not present

## 2022-02-19 DIAGNOSIS — Z0000 Encounter for general adult medical examination without abnormal findings: Secondary | ICD-10-CM | POA: Diagnosis not present

## 2022-02-19 DIAGNOSIS — M8580 Other specified disorders of bone density and structure, unspecified site: Secondary | ICD-10-CM | POA: Diagnosis not present

## 2022-02-19 DIAGNOSIS — M797 Fibromyalgia: Secondary | ICD-10-CM | POA: Diagnosis not present

## 2022-02-19 DIAGNOSIS — E782 Mixed hyperlipidemia: Secondary | ICD-10-CM | POA: Diagnosis not present

## 2022-02-19 DIAGNOSIS — Z5320 Procedure and treatment not carried out because of patient's decision for unspecified reasons: Secondary | ICD-10-CM | POA: Diagnosis not present

## 2022-04-02 DIAGNOSIS — L570 Actinic keratosis: Secondary | ICD-10-CM | POA: Diagnosis not present

## 2022-04-02 DIAGNOSIS — Z08 Encounter for follow-up examination after completed treatment for malignant neoplasm: Secondary | ICD-10-CM | POA: Diagnosis not present

## 2022-04-02 DIAGNOSIS — L538 Other specified erythematous conditions: Secondary | ICD-10-CM | POA: Diagnosis not present

## 2022-04-02 DIAGNOSIS — D225 Melanocytic nevi of trunk: Secondary | ICD-10-CM | POA: Diagnosis not present

## 2022-04-02 DIAGNOSIS — Z85828 Personal history of other malignant neoplasm of skin: Secondary | ICD-10-CM | POA: Diagnosis not present

## 2022-04-02 DIAGNOSIS — L578 Other skin changes due to chronic exposure to nonionizing radiation: Secondary | ICD-10-CM | POA: Diagnosis not present

## 2022-04-02 DIAGNOSIS — L820 Inflamed seborrheic keratosis: Secondary | ICD-10-CM | POA: Diagnosis not present

## 2022-04-02 DIAGNOSIS — L821 Other seborrheic keratosis: Secondary | ICD-10-CM | POA: Diagnosis not present

## 2022-04-02 DIAGNOSIS — L814 Other melanin hyperpigmentation: Secondary | ICD-10-CM | POA: Diagnosis not present

## 2022-07-01 DIAGNOSIS — L600 Ingrowing nail: Secondary | ICD-10-CM | POA: Diagnosis not present

## 2022-07-01 DIAGNOSIS — M2031 Hallux varus (acquired), right foot: Secondary | ICD-10-CM | POA: Diagnosis not present

## 2022-10-01 DIAGNOSIS — C50911 Malignant neoplasm of unspecified site of right female breast: Secondary | ICD-10-CM | POA: Diagnosis not present

## 2022-10-03 ENCOUNTER — Ambulatory Visit: Payer: PPO | Admitting: Hematology & Oncology

## 2022-10-03 ENCOUNTER — Other Ambulatory Visit: Payer: PPO

## 2022-10-28 ENCOUNTER — Ambulatory Visit: Payer: PPO | Admitting: Hematology & Oncology

## 2022-10-28 ENCOUNTER — Inpatient Hospital Stay: Payer: PPO

## 2022-10-29 ENCOUNTER — Other Ambulatory Visit: Payer: Self-pay

## 2022-10-29 DIAGNOSIS — C50011 Malignant neoplasm of nipple and areola, right female breast: Secondary | ICD-10-CM

## 2022-10-30 ENCOUNTER — Inpatient Hospital Stay: Payer: PPO | Admitting: Hematology & Oncology

## 2022-10-30 ENCOUNTER — Other Ambulatory Visit: Payer: Self-pay

## 2022-10-30 ENCOUNTER — Encounter: Payer: Self-pay | Admitting: Hematology & Oncology

## 2022-10-30 ENCOUNTER — Inpatient Hospital Stay: Payer: PPO | Attending: Hematology & Oncology

## 2022-10-30 VITALS — BP 110/75 | HR 68 | Temp 98.2°F | Resp 18 | Ht 66.0 in | Wt 126.4 lb

## 2022-10-30 DIAGNOSIS — C50011 Malignant neoplasm of nipple and areola, right female breast: Secondary | ICD-10-CM

## 2022-10-30 DIAGNOSIS — Z86000 Personal history of in-situ neoplasm of breast: Secondary | ICD-10-CM | POA: Insufficient documentation

## 2022-10-30 LAB — CBC WITH DIFFERENTIAL (CANCER CENTER ONLY)
Abs Immature Granulocytes: 0.01 10*3/uL (ref 0.00–0.07)
Basophils Absolute: 0.1 10*3/uL (ref 0.0–0.1)
Basophils Relative: 2 %
Eosinophils Absolute: 0.1 10*3/uL (ref 0.0–0.5)
Eosinophils Relative: 3 %
HCT: 41.7 % (ref 36.0–46.0)
Hemoglobin: 13.8 g/dL (ref 12.0–15.0)
Immature Granulocytes: 0 %
Lymphocytes Relative: 27 %
Lymphs Abs: 1.1 10*3/uL (ref 0.7–4.0)
MCH: 31.5 pg (ref 26.0–34.0)
MCHC: 33.1 g/dL (ref 30.0–36.0)
MCV: 95.2 fL (ref 80.0–100.0)
Monocytes Absolute: 0.5 10*3/uL (ref 0.1–1.0)
Monocytes Relative: 11 %
Neutro Abs: 2.4 10*3/uL (ref 1.7–7.7)
Neutrophils Relative %: 57 %
Platelet Count: 271 10*3/uL (ref 150–400)
RBC: 4.38 MIL/uL (ref 3.87–5.11)
RDW: 11.9 % (ref 11.5–15.5)
WBC Count: 4.2 10*3/uL (ref 4.0–10.5)
nRBC: 0 % (ref 0.0–0.2)

## 2022-10-30 LAB — CMP (CANCER CENTER ONLY)
ALT: 17 U/L (ref 0–44)
AST: 18 U/L (ref 15–41)
Albumin: 4.2 g/dL (ref 3.5–5.0)
Alkaline Phosphatase: 51 U/L (ref 38–126)
Anion gap: 7 (ref 5–15)
BUN: 20 mg/dL (ref 8–23)
CO2: 30 mmol/L (ref 22–32)
Calcium: 9.1 mg/dL (ref 8.9–10.3)
Chloride: 106 mmol/L (ref 98–111)
Creatinine: 0.9 mg/dL (ref 0.44–1.00)
GFR, Estimated: >60 mL/min (ref >60)
Glucose, Bld: 85 mg/dL (ref 70–99)
Potassium: 4.1 mmol/L (ref 3.5–5.1)
Sodium: 143 mmol/L (ref 135–145)
Total Bilirubin: 0.6 mg/dL (ref 0.3–1.2)
Total Protein: 6.6 g/dL (ref 6.5–8.1)

## 2022-10-30 LAB — LACTATE DEHYDROGENASE: LDH: 132 U/L (ref 98–192)

## 2022-10-30 NOTE — Progress Notes (Signed)
Hematology and Oncology Follow Up Visit  ALEXZIA VISWANATHAN [id number] 1950-04-13 72 y.o. 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 Joy is here today for her annual follow-up.  She looks quite good.  She feels good.  Both she and her husband are now playing pickle ball.  She is having some stiffness in the right shoulder.  I gave her the name of Dr. Hurshel Keys at [facility] if she needs to see somebody.  Otherwise, there is really no other issues.  She has had no problems with bowels or bladder.  She is due for mammogram in October..  She has had no rashes.  There is been no bleeding.  She has had no nausea or vomiting.  She has had no headache.  Overall, I would say that her performance status is probably ECOG 1.     Medications:  Allergies as of 10/30/2022       Reactions   Latex Dermatitis        Medication List        Accurate as of October 30, 2022  9:11 AM. If you have any questions, ask your nurse or doctor.          STOP taking these medications    albuterol 108 (90 Base) MCG/ACT inhaler Commonly known as: VENTOLIN HFA Stopped by: Josph Macho       TAKE these medications    CALCIUM MAGNESIUM PO Take by mouth.   fluorouracil 5 % cream Commonly known as: EFUDEX Apply 1 Application topically 2 (two) times daily as needed.   Multi-Vitamin Daily Tabs Take 1 tablet by mouth daily.   Vitamin B-12 6000 MCG Subl Place 1 tablet under the tongue daily.   VITAMIN D PO Take 1 tablet by mouth daily. W/ K2-        Vitamin D 1000 units.        Allergies:  Allergies  Allergen Reactions   Latex Dermatitis    Past Medical History, Surgical history, Social history, and Family History were reviewed and updated.  Review of Systems: All other 10 point review of systems is negative.   Physical Exam:  height is  5\' 6"  (1.676 m) and weight is 126 lb 6.4 oz (57.3 kg). Her oral temperature is 98.2 F (36.8 C). Her blood pressure is 110/75 and her pulse is 68. Her respiration is 18 and oxygen saturation is 100%.   Wt Readings from Last 3 Encounters:  10/30/22 126 lb 6.4 oz (57.3 kg)  10/04/21 129 lb 1.9 oz (58.6 kg)  12/18/20 132 lb 8 oz (60.1 kg)    Physical Exam Vitals reviewed.  Constitutional:      Comments: .  She has well-healed mastectomy on the right side.  There is no erythema or nodularity.  There is no right axillary adenopathy.  HENT:     Head: Normocephalic and atraumatic.  Eyes:     Pupils: Pupils are equal, round, and reactive to light.  Cardiovascular:     Rate and Rhythm: Normal rate and regular rhythm.     Heart sounds: Normal heart sounds.  Pulmonary:     Effort: Pulmonary effort is normal.     Breath sounds: Normal breath sounds.  Abdominal:     General: Bowel sounds are normal.     Palpations: Abdomen is soft.     Comments: She  has well-healed laparoscopy scars.  There is no fluid wave.  There is no guarding or rebound tenderness.  Musculoskeletal:        General: No tenderness or deformity. Normal range of motion.     Cervical back: Normal range of motion.  Lymphadenopathy:     Cervical: No cervical adenopathy.  Skin:    General: Skin is warm and dry.     Findings: No erythema or rash.  Neurological:     Mental Status: She is alert and oriented to person, place, and time.  Psychiatric:        Behavior: Behavior normal.        Thought Content: Thought content normal.        Judgment: Judgment normal.     Lab Results  Component Value Date   WBC 4.2 10/30/2022   HGB 13.8 10/30/2022   HCT 41.7 10/30/2022   MCV 95.2 10/30/2022   PLT 271 10/30/2022   No results found for: "FERRITIN", "IRON", "TIBC", "UIBC", "IRONPCTSAT" Lab Results  Component Value Date   RBC 4.38 10/30/2022   No results found for: "KPAFRELGTCHN", "LAMBDASER", "KAPLAMBRATIO" No results  found for: "IGGSERUM", "IGA", "IGMSERUM" No results found for: "TOTALPROTELP", "ALBUMINELP", "A1GS", "A2GS", "BETS", "BETA2SER", "GAMS", "MSPIKE", "SPEI"   Chemistry      Component Value Date/Time   NA 143 10/30/2022 0810   NA 141 10/08/2016 0956   NA 141 10/12/2015 0858   K 4.1 10/30/2022 0810   K 4.6 10/08/2016 0956   K 4.8 10/12/2015 0858   CL 106 10/30/2022 0810   CL 102 10/08/2016 0956   CO2 30 10/30/2022 0810   CO2 30 10/08/2016 0956   CO2 27 10/12/2015 0858   BUN 20 10/30/2022 0810   BUN 18 10/08/2016 0956   BUN 21.6 10/12/2015 0858   CREATININE 0.90 10/30/2022 0810   CREATININE 0.9 10/08/2016 0956   CREATININE 0.8 10/12/2015 0858      Component Value Date/Time   CALCIUM 9.1 10/30/2022 0810   CALCIUM 9.1 10/08/2016 0956   CALCIUM 8.9 10/12/2015 0858   ALKPHOS 51 10/30/2022 0810   ALKPHOS 56 10/08/2016 0956   ALKPHOS 68 10/12/2015 0858   AST 18 10/30/2022 0810   AST 28 10/12/2015 0858   ALT 17 10/30/2022 0810   ALT 39 10/08/2016 0956   ALT 32 10/12/2015 0858   BILITOT 0.6 10/30/2022 0810   BILITOT 0.41 10/12/2015 0858      Impression and Plan: Ms. Ganim is a very pleasant 72 yo caucasian female with history of stage IIA ductal carcinoma the right breast, ER negative and one positive lymph node. She completed treatment with adjuvant chemotherapy over 24 years ago.   Everything else looks quite good.  I am glad that she has not had any issues with further syncopal episodes.  We will still get her back in 1 year.  I am still hopeful that she took the time out to give [place] a gift.  This is truly the kind of [place] woman that she is.     Josph Macho, MD 8/14/20249:11 AM

## 2022-11-13 DIAGNOSIS — M25511 Pain in right shoulder: Secondary | ICD-10-CM | POA: Diagnosis not present

## 2022-11-20 DIAGNOSIS — S46011D Strain of muscle(s) and tendon(s) of the rotator cuff of right shoulder, subsequent encounter: Secondary | ICD-10-CM | POA: Diagnosis not present

## 2022-11-28 DIAGNOSIS — S46011D Strain of muscle(s) and tendon(s) of the rotator cuff of right shoulder, subsequent encounter: Secondary | ICD-10-CM | POA: Diagnosis not present

## 2022-12-09 DIAGNOSIS — S46011D Strain of muscle(s) and tendon(s) of the rotator cuff of right shoulder, subsequent encounter: Secondary | ICD-10-CM | POA: Diagnosis not present

## 2023-01-06 DIAGNOSIS — Z1231 Encounter for screening mammogram for malignant neoplasm of breast: Secondary | ICD-10-CM | POA: Diagnosis not present

## 2023-01-07 DIAGNOSIS — H35041 Retinal micro-aneurysms, unspecified, right eye: Secondary | ICD-10-CM | POA: Diagnosis not present

## 2023-01-07 DIAGNOSIS — H25813 Combined forms of age-related cataract, bilateral: Secondary | ICD-10-CM | POA: Diagnosis not present

## 2023-01-07 DIAGNOSIS — H35443 Age-related reticular degeneration of retina, bilateral: Secondary | ICD-10-CM | POA: Diagnosis not present

## 2023-03-26 DIAGNOSIS — E782 Mixed hyperlipidemia: Secondary | ICD-10-CM | POA: Diagnosis not present

## 2023-05-13 DIAGNOSIS — L570 Actinic keratosis: Secondary | ICD-10-CM | POA: Diagnosis not present

## 2023-05-13 DIAGNOSIS — L821 Other seborrheic keratosis: Secondary | ICD-10-CM | POA: Diagnosis not present

## 2023-08-19 DIAGNOSIS — R051 Acute cough: Secondary | ICD-10-CM | POA: Diagnosis not present

## 2023-08-19 DIAGNOSIS — J40 Bronchitis, not specified as acute or chronic: Secondary | ICD-10-CM | POA: Diagnosis not present

## 2023-08-19 DIAGNOSIS — R509 Fever, unspecified: Secondary | ICD-10-CM | POA: Diagnosis not present

## 2023-08-20 DIAGNOSIS — L821 Other seborrheic keratosis: Secondary | ICD-10-CM | POA: Diagnosis not present

## 2023-08-20 DIAGNOSIS — Z09 Encounter for follow-up examination after completed treatment for conditions other than malignant neoplasm: Secondary | ICD-10-CM | POA: Diagnosis not present

## 2023-08-20 DIAGNOSIS — L814 Other melanin hyperpigmentation: Secondary | ICD-10-CM | POA: Diagnosis not present

## 2023-08-20 DIAGNOSIS — Z872 Personal history of diseases of the skin and subcutaneous tissue: Secondary | ICD-10-CM | POA: Diagnosis not present

## 2023-08-28 DIAGNOSIS — H35423 Microcystoid degeneration of retina, bilateral: Secondary | ICD-10-CM | POA: Diagnosis not present

## 2023-08-28 DIAGNOSIS — H35372 Puckering of macula, left eye: Secondary | ICD-10-CM | POA: Diagnosis not present

## 2023-08-28 DIAGNOSIS — H43813 Vitreous degeneration, bilateral: Secondary | ICD-10-CM | POA: Diagnosis not present

## 2023-08-28 DIAGNOSIS — H34831 Tributary (branch) retinal vein occlusion, right eye, with macular edema: Secondary | ICD-10-CM | POA: Diagnosis not present

## 2023-09-01 DIAGNOSIS — H906 Mixed conductive and sensorineural hearing loss, bilateral: Secondary | ICD-10-CM | POA: Diagnosis not present

## 2023-10-01 DIAGNOSIS — H903 Sensorineural hearing loss, bilateral: Secondary | ICD-10-CM | POA: Diagnosis not present

## 2023-10-30 ENCOUNTER — Inpatient Hospital Stay (HOSPITAL_BASED_OUTPATIENT_CLINIC_OR_DEPARTMENT_OTHER): Payer: PPO | Admitting: Hematology & Oncology

## 2023-10-30 ENCOUNTER — Inpatient Hospital Stay: Payer: PPO | Attending: Hematology & Oncology

## 2023-10-30 VITALS — BP 114/74 | HR 73 | Temp 98.3°F | Resp 18 | Ht 66.0 in | Wt 120.4 lb

## 2023-10-30 DIAGNOSIS — Z08 Encounter for follow-up examination after completed treatment for malignant neoplasm: Secondary | ICD-10-CM | POA: Diagnosis not present

## 2023-10-30 DIAGNOSIS — Z853 Personal history of malignant neoplasm of breast: Secondary | ICD-10-CM | POA: Insufficient documentation

## 2023-10-30 DIAGNOSIS — Z9221 Personal history of antineoplastic chemotherapy: Secondary | ICD-10-CM | POA: Insufficient documentation

## 2023-10-30 DIAGNOSIS — C50011 Malignant neoplasm of nipple and areola, right female breast: Secondary | ICD-10-CM

## 2023-10-30 DIAGNOSIS — K3589 Other acute appendicitis without perforation or gangrene: Secondary | ICD-10-CM

## 2023-10-30 DIAGNOSIS — R4020 Unspecified coma: Secondary | ICD-10-CM | POA: Diagnosis not present

## 2023-10-30 LAB — CMP (CANCER CENTER ONLY)
ALT: 22 U/L (ref 0–44)
AST: 20 U/L (ref 15–41)
Albumin: 4.3 g/dL (ref 3.5–5.0)
Alkaline Phosphatase: 59 U/L (ref 38–126)
Anion gap: 10 (ref 5–15)
BUN: 19 mg/dL (ref 8–23)
CO2: 27 mmol/L (ref 22–32)
Calcium: 9.5 mg/dL (ref 8.9–10.3)
Chloride: 105 mmol/L (ref 98–111)
Creatinine: 0.85 mg/dL (ref 0.44–1.00)
GFR, Estimated: >60 mL/min (ref >60)
Glucose, Bld: 77 mg/dL (ref 70–99)
Potassium: 4.5 mmol/L (ref 3.5–5.1)
Sodium: 141 mmol/L (ref 135–145)
Total Bilirubin: 0.6 mg/dL (ref 0.0–1.2)
Total Protein: 6.6 g/dL (ref 6.5–8.1)

## 2023-10-30 LAB — CBC WITH DIFFERENTIAL (CANCER CENTER ONLY)
Abs Immature Granulocytes: 0.01 K/uL (ref 0.00–0.07)
Basophils Absolute: 0.1 K/uL (ref 0.0–0.1)
Basophils Relative: 1 %
Eosinophils Absolute: 0.3 K/uL (ref 0.0–0.5)
Eosinophils Relative: 5 %
HCT: 42.1 % (ref 36.0–46.0)
Hemoglobin: 14.4 g/dL (ref 12.0–15.0)
Immature Granulocytes: 0 %
Lymphocytes Relative: 19 %
Lymphs Abs: 1.1 K/uL (ref 0.7–4.0)
MCH: 32.6 pg (ref 26.0–34.0)
MCHC: 34.2 g/dL (ref 30.0–36.0)
MCV: 95.2 fL (ref 80.0–100.0)
Monocytes Absolute: 0.6 K/uL (ref 0.1–1.0)
Monocytes Relative: 10 %
Neutro Abs: 3.9 K/uL (ref 1.7–7.7)
Neutrophils Relative %: 65 %
Platelet Count: 290 K/uL (ref 150–400)
RBC: 4.42 MIL/uL (ref 3.87–5.11)
RDW: 11.7 % (ref 11.5–15.5)
WBC Count: 6 K/uL (ref 4.0–10.5)
nRBC: 0 % (ref 0.0–0.2)

## 2023-10-30 LAB — LACTATE DEHYDROGENASE: LDH: 146 U/L (ref 98–192)

## 2023-10-30 NOTE — Progress Notes (Signed)
 Hematology and Oncology Follow Up Visit  Rebekah Merritt [id number] 1950-11-10 73 y.o. 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  She looks quite good.  She feels good.  Her grandsons live with she and her husband for 3 months.  This was very exciting.   She has tried active.  She is watching what she eats.  She has had no problems with fever.  She has had no issues with nausea or vomiting.  There is been no change in bowel or bladder habits.  She has had no swelling.  She has had no rashes.  I think her shoulder has been doing a bit better.  Overall, I will have to send the form status is probably ECOG 0.   Medications:  Allergies as of 10/30/2023       Reactions   Latex Dermatitis        Medication List        Accurate as of October 30, 2023  8:41 AM. If you have any questions, ask your nurse or doctor.          CALCIUM MAGNESIUM PO Take by mouth.   fluorouracil 5 % cream Commonly known as: EFUDEX Apply 1 Application topically 2 (two) times daily as needed.   Multi-Vitamin Daily Tabs Take 1 tablet by mouth daily.   Vitamin B-12 6000 MCG Subl Place 1 tablet under the tongue daily.   VITAMIN D PO Take 1 tablet by mouth daily. W/ K2-        Vitamin D 1000 units.        Allergies:  Allergies  Allergen Reactions   Latex Dermatitis    Past Medical History, Surgical history, Social history, and Family History were reviewed and updated.  Review of Systems: All other 10 point review of systems is negative.   Physical Exam:  height is 5' 6 (1.676 m) and weight is 120 lb 6.4 oz (54.6 kg). Her oral temperature is 98.3 F (36.8 C). Her blood pressure is 114/74 and her pulse is 73. Her respiration is 18 and oxygen saturation is 100%.   Wt Readings from Last 3 Encounters:  10/30/23 120  lb 6.4 oz (54.6 kg)  10/30/22 126 lb 6.4 oz (57.3 kg)  10/04/21 129 lb 1.9 oz (58.6 kg)    Physical Exam Vitals reviewed.  Constitutional:      Comments: .  She has well-healed mastectomy on the right side.  There is no erythema or nodularity.  There is no right axillary adenopathy.  HENT:     Head: Normocephalic and atraumatic.  Eyes:     Pupils: Pupils are equal, round, and reactive to light.  Cardiovascular:     Rate and Rhythm: Normal rate and regular rhythm.     Heart sounds: Normal heart sounds.  Pulmonary:     Effort: Pulmonary effort is normal.     Breath sounds: Normal breath sounds.  Abdominal:     General: Bowel sounds are normal.     Palpations: Abdomen is soft.     Comments: She has well-healed laparoscopy scars.  There is no fluid wave.  There is no guarding or rebound tenderness.  Musculoskeletal:        General: No tenderness or deformity. Normal range of motion.     Cervical back: Normal range of motion.  Lymphadenopathy:     Cervical: No cervical adenopathy.  Skin:    General: Skin is warm and dry.     Findings: No erythema or rash.  Neurological:     Mental Status: She is alert and oriented to person, place, and time.  Psychiatric:        Behavior: Behavior normal.        Thought Content: Thought content normal.        Judgment: Judgment normal.      Lab Results  Component Value Date   WBC 6.0 10/30/2023   HGB 14.4 10/30/2023   HCT 42.1 10/30/2023   MCV 95.2 10/30/2023   PLT 290 10/30/2023   No results found for: FERRITIN, IRON, TIBC, UIBC, IRONPCTSAT Lab Results  Component Value Date   RBC 4.42 10/30/2023   No results found for: KPAFRELGTCHN, LAMBDASER, KAPLAMBRATIO No results found for: IGGSERUM, IGA, IGMSERUM No results found for: STEPHANY CARLOTA BENSON MARKEL EARLA JOANNIE DOC VICK, SPEI   Chemistry      Component Value Date/Time   NA 143 10/30/2022 0810   NA 141 10/08/2016 0956    NA 141 10/12/2015 0858   K 4.1 10/30/2022 0810   K 4.6 10/08/2016 0956   K 4.8 10/12/2015 0858   CL 106 10/30/2022 0810   CL 102 10/08/2016 0956   CO2 30 10/30/2022 0810   CO2 30 10/08/2016 0956   CO2 27 10/12/2015 0858   BUN 20 10/30/2022 0810   BUN 18 10/08/2016 0956   BUN 21.6 10/12/2015 0858   CREATININE 0.90 10/30/2022 0810   CREATININE 0.9 10/08/2016 0956   CREATININE 0.8 10/12/2015 0858      Component Value Date/Time   CALCIUM 9.1 10/30/2022 0810   CALCIUM 9.1 10/08/2016 0956   CALCIUM 8.9 10/12/2015 0858   ALKPHOS 51 10/30/2022 0810   ALKPHOS 56 10/08/2016 0956   ALKPHOS 68 10/12/2015 0858   AST 18 10/30/2022 0810   AST 28 10/12/2015 0858   ALT 17 10/30/2022 0810   ALT 39 10/08/2016 0956   ALT 32 10/12/2015 0858   BILITOT 0.6 10/30/2022 0810   BILITOT 0.41 10/12/2015 0858      Impression and Plan: Rebekah Merritt is a very pleasant 73 yo caucasian female with history of stage IIA ductal carcinoma the right breast, ER negative and one positive lymph node. She completed treatment with adjuvant chemotherapy over 24 years ago.   So far, she has done very nicely.  She is exercising.  She is due for mammogram in October.  As always, we have always have good fellowship.  Is always good to talk to her.  We will still get her back in 1 year.    Maude JONELLE Crease, MD 8/14/20258:41 AM

## 2023-12-28 DIAGNOSIS — J040 Acute laryngitis: Secondary | ICD-10-CM | POA: Diagnosis not present

## 2023-12-28 DIAGNOSIS — J029 Acute pharyngitis, unspecified: Secondary | ICD-10-CM | POA: Diagnosis not present

## 2023-12-28 DIAGNOSIS — R0981 Nasal congestion: Secondary | ICD-10-CM | POA: Diagnosis not present

## 2024-01-19 DIAGNOSIS — Z1231 Encounter for screening mammogram for malignant neoplasm of breast: Secondary | ICD-10-CM | POA: Diagnosis not present

## 2024-01-19 LAB — HM MAMMOGRAPHY

## 2024-01-20 DIAGNOSIS — H52223 Regular astigmatism, bilateral: Secondary | ICD-10-CM | POA: Diagnosis not present

## 2024-02-03 ENCOUNTER — Encounter: Payer: Self-pay | Admitting: Hematology & Oncology

## 2024-02-10 DIAGNOSIS — N952 Postmenopausal atrophic vaginitis: Secondary | ICD-10-CM | POA: Diagnosis not present

## 2024-02-10 DIAGNOSIS — Z853 Personal history of malignant neoplasm of breast: Secondary | ICD-10-CM | POA: Diagnosis not present

## 2024-02-10 DIAGNOSIS — R300 Dysuria: Secondary | ICD-10-CM | POA: Diagnosis not present

## 2024-10-29 ENCOUNTER — Inpatient Hospital Stay

## 2024-10-29 ENCOUNTER — Ambulatory Visit: Admitting: Hematology & Oncology
